# Patient Record
Sex: Male | Born: 1965
Health system: Southern US, Community
[De-identification: ages and names within clinical notes are randomized; demographics above are authoritative.]

## PROBLEM LIST (undated history)

## (undated) DIAGNOSIS — G8929 Other chronic pain: Secondary | ICD-10-CM

## (undated) DIAGNOSIS — M545 Low back pain, unspecified: Secondary | ICD-10-CM

## (undated) DIAGNOSIS — N200 Calculus of kidney: Secondary | ICD-10-CM

## (undated) DIAGNOSIS — G4730 Sleep apnea, unspecified: Secondary | ICD-10-CM

## (undated) DIAGNOSIS — E785 Hyperlipidemia, unspecified: Secondary | ICD-10-CM

## (undated) DIAGNOSIS — I2510 Atherosclerotic heart disease of native coronary artery without angina pectoris: Secondary | ICD-10-CM

## (undated) DIAGNOSIS — M109 Gout, unspecified: Secondary | ICD-10-CM

## (undated) DIAGNOSIS — E119 Type 2 diabetes mellitus without complications: Secondary | ICD-10-CM

## (undated) DIAGNOSIS — I10 Essential (primary) hypertension: Secondary | ICD-10-CM

## (undated) HISTORY — DX: Hyperlipidemia, unspecified: E78.5

## (undated) HISTORY — DX: Type 2 diabetes mellitus without complications: E11.9

## (undated) HISTORY — DX: Essential (primary) hypertension: I10

## (undated) HISTORY — DX: Sleep apnea, unspecified: G47.30

## (undated) HISTORY — DX: Gout, unspecified: M10.9

## (undated) HISTORY — PX: BACK SURGERY: SHX140

## (undated) HISTORY — PX: TONSILLECTOMY: SUR1361

---

## 2003-11-26 HISTORY — PX: LUMBAR LAMINECTOMY: SHX95

## 2004-12-05 ENCOUNTER — Ambulatory Visit (HOSPITAL_COMMUNITY): Admission: RE | Admit: 2004-12-05 | Discharge: 2004-12-06 | Payer: Self-pay | Admitting: Neurosurgery

## 2014-08-18 ENCOUNTER — Ambulatory Visit (INDEPENDENT_AMBULATORY_CARE_PROVIDER_SITE_OTHER): Payer: Managed Care, Other (non HMO) | Admitting: Cardiology

## 2014-08-18 ENCOUNTER — Encounter: Payer: Self-pay | Admitting: Cardiology

## 2014-08-18 VITALS — BP 140/102 | HR 72 | Ht 70.0 in | Wt 211.2 lb

## 2014-08-18 DIAGNOSIS — R079 Chest pain, unspecified: Secondary | ICD-10-CM | POA: Insufficient documentation

## 2014-08-18 DIAGNOSIS — R072 Precordial pain: Secondary | ICD-10-CM | POA: Insufficient documentation

## 2014-08-18 DIAGNOSIS — M546 Pain in thoracic spine: Secondary | ICD-10-CM | POA: Insufficient documentation

## 2014-08-18 NOTE — Progress Notes (Signed)
HPI The patient presents for evaluation of exertional chest discomfort. He has no history of coronary disease but he does have a strong family history of coronary disease. He has multiple cardiovascular risk factors. He's been getting chest discomfort since he started exercising recently. He had been walking long distances about a year ago but got out of this habit. He gained some weight. He is newly married and has started walking again. With this he has some chest tightness with his peak exertion. He says his heart rate has to be about 120 before this will happen. He would develop some midsternal discomfort. It might be 5/10. Goes away about 1 minute after he stopped he is doing. He does not describe associated symptoms such as nausea vomiting or diaphoresis. He is not having resting symptoms. He does not have shortness of breath and he denies any PND or orthopnea. He thinks it has been a relatively stable pattern.  No Known Allergies  Current Outpatient Prescriptions  Medication Sig Dispense Refill  . aspirin 81 MG tablet Take 81 mg by mouth daily.      Marland Kitchen losartan (COZAAR) 50 MG tablet Take 100 mg by mouth daily.       No current facility-administered medications for this visit.    Past Medical History  Diagnosis Date  . Diabetes     not since losing weight  . Hyperlipemia     mild  . HTN (hypertension)   . Gout   . Sleep apnea     not an issue with weight loss    Past Surgical History  Procedure Laterality Date  . Lumbar laminectomy      Family History  Problem Relation Age of Onset  . CAD Father 62    AVR  . CAD Mother 73    CABG  . Diabetes Brother     History   Social History  . Marital Status: Married    Spouse Name: N/A    Number of Children: 3  . Years of Education: N/A   Occupational History  . Sales    Social History Main Topics  . Smoking status: Never Smoker   . Smokeless tobacco: Not on file  . Alcohol Use: Yes     Comment: rare  . Drug Use:  No  . Sexual Activity: Not on file   Other Topics Concern  . Not on file   Social History Narrative   Lives at home with 4 children.      ROS:  As stated in the HPI and negative for all other systems.  PHYSICAL EXAM BP 140/102  Pulse 72  Ht  (1.778 m)  Wt 211 lb 3.2 oz (95.8 kg)  BMI 30.30 kg/m2 GENERAL:  Well appearing HEENT:  Pupils equal round and reactive, fundi not visualized, oral mucosa unremarkable NECK:  No jugular venous distention, waveform within normal limits, carotid upstroke brisk and symmetric, no bruits, no thyromegaly LYMPHATICS:  No cervical, inguinal adenopathy LUNGS:  Clear to auscultation bilaterally BACK:  No CVA tenderness CHEST:  Unremarkable HEART:  PMI not displaced or sustained,S1 and S2 within normal limits, no S3, no S4, no clicks, no rubs,  no murmurs ABD:  Flat, positive bowel sounds normal in frequency in pitch, no bruits, no rebound, no guarding, no midline pulsatile mass, no hepatomegaly, no splenomegaly EXT:  2 plus pulses throughout, no edema, no cyanosis no clubbing SKIN:  No rashes no nodules NEURO:  Cranial nerves II through XII grossly intact, motor  grossly intact throughout [facility]:  Cognitively intact, oriented to person place and time  EKG:   Sinus rhythm, rate 79, axis within normal limits,intervals within normal limits, no acute ST-T wave changes.  ASSESSMENT AND PLAN   CAD:  The pretest probability of obstructive coronary disease is moderate. Therefore, stress perfusion imaging is indicated according to current appropriateness guidelines and Bayes rule.  If he has a low risk normal study I would still consider coronary calcium score to further risk stratify and guide primary prevention therapy. Certainly he is an abnormal study we will pursue further management and testing. We had a long discussion about this.  DYSLIPIDEMIA:  He had labs drawn apparently today. I would be happy to review these. Certainly presence or absence of  coronary disease we'll guide our target therapy.  HTN:  His blood pressure is elevated but his losartan was just increased today.

## 2014-08-18 NOTE — Patient Instructions (Signed)
Your physician recommends that you schedule a follow-up appointment after your Stress test

## 2014-08-19 ENCOUNTER — Other Ambulatory Visit (HOSPITAL_COMMUNITY): Payer: Self-pay | Admitting: *Deleted

## 2014-08-19 ENCOUNTER — Encounter: Payer: Self-pay | Admitting: Family Medicine

## 2014-08-22 ENCOUNTER — Encounter: Payer: Self-pay | Admitting: Cardiology

## 2014-08-24 ENCOUNTER — Encounter: Payer: Self-pay | Admitting: Cardiology

## 2014-08-30 ENCOUNTER — Ambulatory Visit (HOSPITAL_COMMUNITY)
Admission: RE | Admit: 2014-08-30 | Discharge: 2014-08-30 | Disposition: A | Discharge status: Home / Self Care | Payer: Managed Care, Other (non HMO) | Source: Ambulatory Visit | Attending: Cardiovascular Disease | Admitting: Cardiovascular Disease

## 2014-08-30 ENCOUNTER — Telehealth: Payer: Self-pay | Admitting: *Deleted

## 2014-08-30 DIAGNOSIS — Z8249 Family history of ischemic heart disease and other diseases of the circulatory system: Secondary | ICD-10-CM | POA: Insufficient documentation

## 2014-08-30 DIAGNOSIS — R079 Chest pain, unspecified: Secondary | ICD-10-CM | POA: Insufficient documentation

## 2014-08-30 DIAGNOSIS — I10 Essential (primary) hypertension: Secondary | ICD-10-CM | POA: Diagnosis not present

## 2014-08-30 DIAGNOSIS — R42 Dizziness and giddiness: Secondary | ICD-10-CM | POA: Diagnosis not present

## 2014-08-30 DIAGNOSIS — R002 Palpitations: Secondary | ICD-10-CM | POA: Insufficient documentation

## 2014-08-30 DIAGNOSIS — E663 Overweight: Secondary | ICD-10-CM | POA: Diagnosis not present

## 2014-08-30 MED ORDER — REGADENOSON 0.4 MG/5ML IV SOLN
0.4000 mg | Freq: Once | INTRAVENOUS | Status: AC
  Administered 2014-08-30: 0.4 mg via INTRAVENOUS

## 2014-08-30 MED ORDER — TECHNETIUM TC 99M SESTAMIBI GENERIC - CARDIOLITE
30.0000 | Freq: Once | INTRAVENOUS | Status: AC | PRN
  Administered 2014-08-30: 30 via INTRAVENOUS

## 2014-08-30 MED ORDER — TECHNETIUM TC 99M SESTAMIBI GENERIC - CARDIOLITE
10.0000 | Freq: Once | INTRAVENOUS | Status: AC | PRN
  Administered 2014-08-30: 10 via INTRAVENOUS

## 2014-08-30 NOTE — Procedures (Addendum)
[facility] [address] [place] [address] [phone]  Cardiology Nuclear Med Study  Michael Duran is a 48 y.o. male     MRN : [id number]     DOB: 12/10/1965  Procedure Date: 08/30/2014  Nuclear Med Background Indication for Stress Test:  Evaluation for Ischemia History:  No prior respiratory or cardiac history reported;No prior NUC MPI for comparison. Cardiac Risk Factors: Family History - CAD, Hypertension, Lipids and Overweight  Symptoms:  Chest Pain, Light-Headedness and Palpitations   Nuclear Pre-Procedure Caffeine/Decaff Intake:  7:00pm NPO After: 5:00am   IV Site: R Forearm  IV 0.9% NS with Angio Cath:  22g  Chest Size (in):  44"  IV Started by: Berdie OgrenAmanda Wease, RN  Height: 5\' 10"  (1.778 m)  Cup Size: n/a  BMI:  Body mass index is 30.28 kg/(m^2). Weight:  211 lb (95.709 kg)   Tech Comments:  Test changed to Lexiscan due to patient not being able to continue walking on treadmill because of chest pain and EKG changes.    Nuclear Med Study 1 or 2 day study: 1 day  Stress Test Type:  Lexiscan  Order Authorizing Provider:  Rollene RotundaJames Hochrein, MD   Resting Radionuclide: Technetium 4893m Sestamibi  Resting Radionuclide Dose: 10.3 mCi   Stress Radionuclide:  Technetium 4093m Sestamibi  Stress Radionuclide Dose: 30.9 mCi           Stress Protocol Rest HR: 78 Stress HR: 108  Rest BP: 161/104 Stress BP: 185/98  Exercise Time (min): n/a METS: n/a          Dose of Adenosine (mg):  n/a Dose of Lexiscan: 0.4 mg  Dose of Atropine (mg): n/a Dose of Dobutamine: n/a mcg/kg/min (at max HR)  Stress Test Technologist: Ernestene MentionGwen Farrington, CCT Nuclear Technologist: [name], CNMT   Rest Procedure:  Myocardial perfusion imaging was performed at rest 45 minutes following the intravenous administration of Technetium 7893m Sestamibi. Stress Procedure:  The patient received IV Lexiscan 0.4 mg over 15-seconds.  Technetium 5893m Sestamibi  injected IV at 30-seconds.  There were no significant changes with Lexiscan.  Quantitative spect images were obtained after a 45 minute delay.  Transient Ischemic Dilatation (Normal <1.22):  1.20 QGS EDV:  124 ml QGS ESV:  56 ml LV Ejection Fraction: 55%        Rest ECG: NSR - Normal EKG  Stress ECG: Initial stress was with Bruce protocol; terminated due ST changes and chest pain; no ST changes with lexiscan infusion.  QPS Raw Data Images:  Acquisition technically good; normal left ventricular size. Stress Images:  There is decreased uptake in the inferior wall. Rest Images:  Normal homogeneous uptake in all areas of the myocardium. Subtraction (SDS):  These findings are consistent with ischemia.  Impression Exercise Capacity: Initial stress was with Bruce protocol; terminated due ST changes and chest pain; no ST changes with lexiscan infusion. BP Response:  Hypertensive blood pressure response. Clinical Symptoms:  There is dyspnea. ECG Impression:  Initial stress was with Bruce protocol; terminated due ST changes and chest pain; no ST changes with lexiscan infusion. Comparison with Prior Nuclear Study: No previous nuclear study performed  Overall Impression:  High risk stress nuclear study with a medium size, mild intensity, reversible inferior and apical defect consistent with ischemia; cannot R/O transient ischemic dilatation of LV cavity which is why study is felt to be high risk; note ECG changes and chest pain during Bruce protocol and stress  subsequently changed to lexiscan.  LV Wall Motion:  NL LV Function; NL Wall Motion   [name], MD  08/30/2014 11:56 AM

## 2014-08-30 NOTE — Telephone Encounter (Signed)
High risk nuc - read today by Dr. Jens Somrenshaw - appmt scheduled for 10/7 @1030am  with Wende MottS. Weaver PA. This information was communicated to patient along with address of this office location - agreeable to come in for earlier office visit r/t symptomatic high risk stress test.   Routed to Dr. Antoine PocheHochrein as Lorain ChildesFYI

## 2014-08-31 ENCOUNTER — Ambulatory Visit (INDEPENDENT_AMBULATORY_CARE_PROVIDER_SITE_OTHER): Payer: Managed Care, Other (non HMO) | Admitting: Physician Assistant

## 2014-08-31 ENCOUNTER — Inpatient Hospital Stay (HOSPITAL_COMMUNITY)
Admission: AD | Admit: 2014-08-31 | Discharge: 2014-09-07 | DRG: 234 | Disposition: A | Discharge status: Home / Self Care | Payer: Managed Care, Other (non HMO) | Source: Ambulatory Visit | Attending: Thoracic Surgery (Cardiothoracic Vascular Surgery) | Admitting: Thoracic Surgery (Cardiothoracic Vascular Surgery)

## 2014-08-31 ENCOUNTER — Encounter (HOSPITAL_COMMUNITY): Payer: Self-pay | Admitting: General Practice

## 2014-08-31 ENCOUNTER — Encounter (HOSPITAL_COMMUNITY)
Admission: AD | Disposition: A | Discharge status: Home / Self Care | Payer: Self-pay | Source: Ambulatory Visit | Attending: Thoracic Surgery (Cardiothoracic Vascular Surgery)

## 2014-08-31 ENCOUNTER — Encounter: Payer: Self-pay | Admitting: Physician Assistant

## 2014-08-31 VITALS — BP 150/85 | HR 75 | Ht 70.0 in | Wt 209.8 lb

## 2014-08-31 DIAGNOSIS — E785 Hyperlipidemia, unspecified: Secondary | ICD-10-CM

## 2014-08-31 DIAGNOSIS — Z7982 Long term (current) use of aspirin: Secondary | ICD-10-CM | POA: Diagnosis not present

## 2014-08-31 DIAGNOSIS — J9811 Atelectasis: Secondary | ICD-10-CM

## 2014-08-31 DIAGNOSIS — N189 Chronic kidney disease, unspecified: Secondary | ICD-10-CM | POA: Diagnosis present

## 2014-08-31 DIAGNOSIS — G8929 Other chronic pain: Secondary | ICD-10-CM | POA: Diagnosis present

## 2014-08-31 DIAGNOSIS — I9789 Other postprocedural complications and disorders of the circulatory system, not elsewhere classified: Secondary | ICD-10-CM | POA: Diagnosis not present

## 2014-08-31 DIAGNOSIS — I25110 Atherosclerotic heart disease of native coronary artery with unstable angina pectoris: Principal | ICD-10-CM | POA: Diagnosis present

## 2014-08-31 DIAGNOSIS — I10 Essential (primary) hypertension: Secondary | ICD-10-CM | POA: Diagnosis not present

## 2014-08-31 DIAGNOSIS — D62 Acute posthemorrhagic anemia: Secondary | ICD-10-CM | POA: Diagnosis present

## 2014-08-31 DIAGNOSIS — Z8249 Family history of ischemic heart disease and other diseases of the circulatory system: Secondary | ICD-10-CM

## 2014-08-31 DIAGNOSIS — I480 Paroxysmal atrial fibrillation: Secondary | ICD-10-CM

## 2014-08-31 DIAGNOSIS — M545 Low back pain: Secondary | ICD-10-CM | POA: Diagnosis present

## 2014-08-31 DIAGNOSIS — I129 Hypertensive chronic kidney disease with stage 1 through stage 4 chronic kidney disease, or unspecified chronic kidney disease: Secondary | ICD-10-CM | POA: Diagnosis present

## 2014-08-31 DIAGNOSIS — I25810 Atherosclerosis of coronary artery bypass graft(s) without angina pectoris: Secondary | ICD-10-CM

## 2014-08-31 DIAGNOSIS — K5669 Other intestinal obstruction: Secondary | ICD-10-CM | POA: Diagnosis not present

## 2014-08-31 DIAGNOSIS — I2584 Coronary atherosclerosis due to calcified coronary lesion: Secondary | ICD-10-CM | POA: Diagnosis present

## 2014-08-31 DIAGNOSIS — Y92239 Unspecified place in hospital as the place of occurrence of the external cause: Secondary | ICD-10-CM | POA: Diagnosis not present

## 2014-08-31 DIAGNOSIS — E119 Type 2 diabetes mellitus without complications: Secondary | ICD-10-CM | POA: Diagnosis present

## 2014-08-31 DIAGNOSIS — Z79899 Other long term (current) drug therapy: Secondary | ICD-10-CM | POA: Diagnosis not present

## 2014-08-31 DIAGNOSIS — E1169 Type 2 diabetes mellitus with other specified complication: Secondary | ICD-10-CM

## 2014-08-31 DIAGNOSIS — Z951 Presence of aortocoronary bypass graft: Secondary | ICD-10-CM

## 2014-08-31 DIAGNOSIS — G4733 Obstructive sleep apnea (adult) (pediatric): Secondary | ICD-10-CM | POA: Diagnosis present

## 2014-08-31 DIAGNOSIS — I2510 Atherosclerotic heart disease of native coronary artery without angina pectoris: Secondary | ICD-10-CM | POA: Diagnosis not present

## 2014-08-31 DIAGNOSIS — R9439 Abnormal result of other cardiovascular function study: Secondary | ICD-10-CM

## 2014-08-31 DIAGNOSIS — I2089 Other forms of angina pectoris: Secondary | ICD-10-CM

## 2014-08-31 DIAGNOSIS — I25118 Atherosclerotic heart disease of native coronary artery with other forms of angina pectoris: Secondary | ICD-10-CM

## 2014-08-31 DIAGNOSIS — Z833 Family history of diabetes mellitus: Secondary | ICD-10-CM

## 2014-08-31 DIAGNOSIS — Y832 Surgical operation with anastomosis, bypass or graft as the cause of abnormal reaction of the patient, or of later complication, without mention of misadventure at the time of the procedure: Secondary | ICD-10-CM | POA: Diagnosis not present

## 2014-08-31 DIAGNOSIS — I208 Other forms of angina pectoris: Secondary | ICD-10-CM | POA: Diagnosis present

## 2014-08-31 DIAGNOSIS — R0789 Other chest pain: Secondary | ICD-10-CM | POA: Diagnosis present

## 2014-08-31 DIAGNOSIS — R072 Precordial pain: Secondary | ICD-10-CM | POA: Diagnosis not present

## 2014-08-31 DIAGNOSIS — K5900 Constipation, unspecified: Secondary | ICD-10-CM | POA: Diagnosis not present

## 2014-08-31 DIAGNOSIS — Z01812 Encounter for preprocedural laboratory examination: Secondary | ICD-10-CM

## 2014-08-31 HISTORY — DX: Low back pain: M54.5

## 2014-08-31 HISTORY — DX: Atherosclerotic heart disease of native coronary artery without angina pectoris: I25.10

## 2014-08-31 HISTORY — DX: Low back pain, unspecified: M54.50

## 2014-08-31 HISTORY — PX: LEFT HEART CATHETERIZATION WITH CORONARY ANGIOGRAM: SHX5451

## 2014-08-31 HISTORY — PX: CARDIAC CATHETERIZATION: SHX172

## 2014-08-31 HISTORY — DX: Other chronic pain: G89.29

## 2014-08-31 HISTORY — DX: Calculus of kidney: N20.0

## 2014-08-31 LAB — PROTIME-INR
INR: 1.06 (ref 0.00–1.49)
Prothrombin Time: 13.8 seconds (ref 11.6–15.2)

## 2014-08-31 LAB — BASIC METABOLIC PANEL
ANION GAP: 11 (ref 5–15)
BUN: 13 mg/dL (ref 6–23)
CALCIUM: 9.6 mg/dL (ref 8.4–10.5)
CO2: 26 meq/L (ref 19–32)
CREATININE: 0.83 mg/dL (ref 0.50–1.35)
Chloride: 102 mEq/L (ref 96–112)
GFR calc Af Amer: >90 mL/min (ref >90)
Glucose, Bld: 102 mg/dL — ABNORMAL HIGH (ref 70–99)
Potassium: 4.4 mEq/L (ref 3.7–5.3)
Sodium: 139 mEq/L (ref 137–147)

## 2014-08-31 LAB — GLUCOSE, CAPILLARY
GLUCOSE-CAPILLARY: 106 mg/dL — AB (ref 70–99)
Glucose-Capillary: 98 mg/dL (ref 70–99)

## 2014-08-31 LAB — CBC
HCT: 40.5 % (ref 39.0–52.0)
HEMOGLOBIN: 14.5 g/dL (ref 13.0–17.0)
MCH: 28.8 pg (ref 26.0–34.0)
MCHC: 35.8 g/dL (ref 30.0–36.0)
MCV: 80.5 fL (ref 78.0–100.0)
PLATELETS: 217 10*3/uL (ref 150–400)
RBC: 5.03 MIL/uL (ref 4.22–5.81)
RDW: 12.8 % (ref 11.5–15.5)
WBC: 6.2 10*3/uL (ref 4.0–10.5)

## 2014-08-31 LAB — TROPONIN I: Troponin I: <0.3 ng/mL

## 2014-08-31 SURGERY — LEFT HEART CATHETERIZATION WITH CORONARY ANGIOGRAM
Anesthesia: LOCAL

## 2014-08-31 MED ORDER — ASPIRIN 81 MG PO CHEW: 81.0000 mg | CHEWABLE_TABLET | ORAL | Status: DC

## 2014-08-31 MED ORDER — ATORVASTATIN CALCIUM 80 MG PO TABS: 80.0000 mg | ORAL_TABLET | Freq: Every day | ORAL | Status: DC

## 2014-08-31 MED ORDER — MIDAZOLAM HCL 2 MG/2ML IJ SOLN
INTRAMUSCULAR | Status: AC
  Filled 2014-08-31: qty 2

## 2014-08-31 MED ORDER — SODIUM CHLORIDE 0.9 % IJ SOLN: 3.0000 mL | INTRAMUSCULAR | Status: DC | PRN

## 2014-08-31 MED ORDER — ISOSORBIDE MONONITRATE ER 60 MG PO TB24
60.0000 mg | ORAL_TABLET | Freq: Every day | ORAL | Status: DC
  Administered 2014-08-31 – 2014-09-01 (×2): 60 mg via ORAL
  Filled 2014-08-31 (×3): qty 1

## 2014-08-31 MED ORDER — NITROGLYCERIN 1 MG/10 ML FOR IR/CATH LAB
INTRA_ARTERIAL | Status: AC
  Filled 2014-08-31: qty 10

## 2014-08-31 MED ORDER — LOSARTAN POTASSIUM 50 MG PO TABS
100.0000 mg | ORAL_TABLET | Freq: Every day | ORAL | Status: DC
  Administered 2014-08-31 – 2014-09-01 (×2): 100 mg via ORAL
  Filled 2014-08-31 (×3): qty 2

## 2014-08-31 MED ORDER — SODIUM CHLORIDE 0.9 % IV SOLN
INTRAVENOUS | Status: DC
Start: 2014-08-31 — End: 2014-08-31
  Administered 2014-08-31: 14:00:00 via INTRAVENOUS

## 2014-08-31 MED ORDER — ACETAMINOPHEN 325 MG PO TABS
650.0000 mg | ORAL_TABLET | ORAL | Status: DC | PRN
  Administered 2014-08-31 – 2014-09-01 (×5): 650 mg via ORAL
  Filled 2014-08-31 (×5): qty 2

## 2014-08-31 MED ORDER — SODIUM CHLORIDE 0.9 % IV SOLN: INTRAVENOUS | Status: AC

## 2014-08-31 MED ORDER — HYDROCHLOROTHIAZIDE 12.5 MG PO CAPS
12.5000 mg | ORAL_CAPSULE | Freq: Every day | ORAL | Status: DC
  Administered 2014-08-31 – 2014-09-01 (×2): 12.5 mg via ORAL
  Filled 2014-08-31 (×3): qty 1

## 2014-08-31 MED ORDER — SODIUM CHLORIDE 0.9 % IV SOLN: 250.0000 mL | INTRAVENOUS | Status: DC | PRN

## 2014-08-31 MED ORDER — SODIUM CHLORIDE 0.9 % IJ SOLN: 3.0000 mL | Freq: Two times a day (BID) | INTRAMUSCULAR | Status: DC

## 2014-08-31 MED ORDER — ATORVASTATIN CALCIUM 80 MG PO TABS
80.0000 mg | ORAL_TABLET | Freq: Every day | ORAL | Status: DC
  Administered 2014-08-31 – 2014-09-06 (×5): 80 mg via ORAL
  Filled 2014-08-31 (×9): qty 1

## 2014-08-31 MED ORDER — VERAPAMIL HCL 2.5 MG/ML IV SOLN
INTRAVENOUS | Status: AC
  Filled 2014-08-31: qty 2

## 2014-08-31 MED ORDER — FENTANYL CITRATE 0.05 MG/ML IJ SOLN
INTRAMUSCULAR | Status: AC
  Filled 2014-08-31: qty 2

## 2014-08-31 MED ORDER — HEPARIN (PORCINE) IN NACL 2-0.9 UNIT/ML-% IJ SOLN
INTRAMUSCULAR | Status: AC
  Filled 2014-08-31: qty 1000

## 2014-08-31 MED ORDER — METOPROLOL SUCCINATE ER 50 MG PO TB24
50.0000 mg | ORAL_TABLET | Freq: Every day | ORAL | Status: DC
  Administered 2014-08-31 – 2014-09-01 (×2): 50 mg via ORAL
  Filled 2014-08-31 (×4): qty 1

## 2014-08-31 MED ORDER — ~~LOC~~ CARDIAC SURGERY, PATIENT & FAMILY EDUCATION
Freq: Once | Status: AC
  Administered 2014-08-31: 22:00:00
  Filled 2014-08-31: qty 1

## 2014-08-31 MED ORDER — ONDANSETRON HCL 4 MG/2ML IJ SOLN: 4.0000 mg | Freq: Four times a day (QID) | INTRAMUSCULAR | Status: DC | PRN

## 2014-08-31 MED ORDER — HEPARIN SODIUM (PORCINE) 1000 UNIT/ML IJ SOLN
INTRAMUSCULAR | Status: AC
  Filled 2014-08-31: qty 1

## 2014-08-31 MED ORDER — ASPIRIN 81 MG PO CHEW
CHEWABLE_TABLET | ORAL | Status: AC
  Administered 2014-08-31: 14:00:00
  Filled 2014-08-31: qty 1

## 2014-08-31 MED ORDER — METOPROLOL SUCCINATE ER 25 MG PO TB24: 25.0000 mg | ORAL_TABLET | Freq: Every day | ORAL | Status: DC

## 2014-08-31 MED ORDER — LIDOCAINE HCL (PF) 1 % IJ SOLN
INTRAMUSCULAR | Status: AC
  Filled 2014-08-31: qty 30

## 2014-08-31 MED ORDER — ASPIRIN EC 325 MG PO TBEC
325.0000 mg | DELAYED_RELEASE_TABLET | Freq: Every day | ORAL | Status: DC
  Administered 2014-09-01: 325 mg via ORAL
  Filled 2014-08-31 (×2): qty 1

## 2014-08-31 MED ORDER — MIDAZOLAM HCL 2 MG/2ML IJ SOLN
INTRAMUSCULAR | Status: AC
Start: 2014-08-31 — End: 2014-08-31
  Filled 2014-08-31: qty 2

## 2014-08-31 MED ORDER — ENOXAPARIN SODIUM 100 MG/ML ~~LOC~~ SOLN
95.0000 mg | Freq: Two times a day (BID) | SUBCUTANEOUS | Status: AC
  Administered 2014-09-01 (×2): 95 mg via SUBCUTANEOUS
  Filled 2014-08-31 (×3): qty 1

## 2014-08-31 MED ORDER — COLCHICINE 0.6 MG PO TABS: 0.6000 mg | ORAL_TABLET | Freq: Every day | ORAL | Status: DC

## 2014-08-31 NOTE — Progress Notes (Signed)
Cardiology Office Note    Date:  08/31/2014   ID:  Michael Duran, DOB [birth year], MRN [id number]  PCP:  Sissy Hoff, MD  Cardiologist:  Dr. Rollene Rotunda      History of Present Illness: Michael Duran is a 48 y.o. male with a history of diabetes, HTN, HL who was recently seen by Dr. Antoine Poche for exertional chest discomfort. Stress perfusion study was arranged. This was abnormal with inferior and apical ischemia and EKG changes during exercise. He is brought back today to discuss proceeding with cardiac catheterization for further evaluation.  He continues to note left-sided exertional chest discomfort. This resolves with rest. He thinks that he may have some associated dyspnea but no radiating symptoms, nausea or diaphoresis. He denies rest symptoms. He denies syncope. He denies orthopnea, PND or edema.   Studies:  - Nuclear (10/15):  Inferior and apical defect consistent with ischemia, cannot rule out TID, ECG changes and chest pain during Bruce protocol; high risk   Recent Labs/Images:  No results found for this basename: NA, K, BUN, CREATININE, ALT, HGB, TSH, LDL, LDLCALC, LDLDIRECT, HDL, BNP, PROBNP,  in the last 8760 hours     Wt Readings from Last 3 Encounters:  08/31/14 209 lb 12.8 oz (95.165 kg)  08/30/14 211 lb (95.709 kg)  08/18/14 211 lb 3.2 oz (95.8 kg)     Past Medical History  Diagnosis Date  . Diabetes     not since losing weight  . Hyperlipemia     mild  . HTN (hypertension)   . Gout   . Sleep apnea     not an issue with weight loss    Current Outpatient Prescriptions  Medication Sig Dispense Refill  . aspirin 81 MG tablet Take 81 mg by mouth daily.      Marland Kitchen COLCRYS 0.6 MG tablet Take 0.6 mg by mouth as needed. PRN FOR GOUT FLARE UP      . losartan (COZAAR) 50 MG tablet Take 100 mg by mouth daily.       No current facility-administered medications for this visit.     Allergies:   Review of patient's allergies indicates no known allergies.    Social History:  The patient  reports that he has never smoked. He does not have any smokeless tobacco history on file. He reports that he drinks alcohol. He reports that he does not use illicit drugs.   Family History:  The patient's family history includes CAD (age of onset: 60) in his father; CAD (age of onset: 15) in his mother; Diabetes in his brother; Heart attack in his father; Heart failure in his mother. There is no history of Stroke.   ROS:  Please see the history of present illness.      All other systems reviewed and negative.    PHYSICAL EXAM: VS:  BP 150/85  Pulse 75  Ht 5\' 10"  (1.778 m)  Wt 209 lb 12.8 oz (95.165 kg)  BMI 30.10 kg/m2 Well nourished, well developed, in no acute distress HEENT: normal Neck: no JVD Cardiac:  normal S1, S2; RRR; no murmur Lungs:  clear to auscultation bilaterally, no wheezing, rhonchi or rales Abd: soft, nontender, no hepatomegaly Ext: no edema Skin: warm and dry Neuro:  CNs 2-12 intact, no focal abnormalities noted  EKG:  NSR, HR 75, inferior Q waves, no ST changes      ASSESSMENT AND PLAN:  1.  Precordial pain:  Patient has symptoms consistent with CCS class III  angina. He has a recent Myoview study that is high risk with inferior and apical ischemia and possible transient ischemic dilatation.  Dr. Antoine PocheHochrein spoke with the patient last night. He has been brought back today to arrange cardiac catheterization. He has not eaten since last night and is under the impression that he was set up for the procedure today.  Risks and benefits of cardiac catheterization have been discussed with the patient.  These include bleeding, infection, kidney damage, stroke, heart attack, death.  The patient understands these risks and is willing to proceed.  He has been placed on the add on board. He [facility]. Continue aspirin. Start Toprol-XL 25 mg daily. Initiate statin therapy.  2.  Type 2 diabetes  mellitus without complication:  Good control with diet. Followup with primary care.  3.  Essential hypertension:  BP elevated. Add Toprol as noted.  4.  HLD (hyperlipidemia): Start Lipitor 80 mg daily.   Disposition:    FU with Dr. Antoine PocheHochrein as scheduled.   Signed, Brynda RimScott Dina Mobley, PA-C, MHS 08/31/2014 11:04 AM    [facility] [address], [place], [place]  [address] Phone: [phone]; Fax: [phone]

## 2014-08-31 NOTE — Interval H&P Note (Signed)
pciCath Lab Visit (complete for each Cath Lab visit)  Clinical Evaluation Leading to the Procedure:   ACS: Yes.    Non-ACS:    Anginal Classification: CCS III  Anti-ischemic medical therapy: Minimal Therapy (1 class of medications)  Non-Invasive Test Results: Intermediate-risk stress test findings: cardiac mortality 1-3%/year  Prior CABG: No previous CABG      History and Physical Interval Note:  08/31/2014 4:08 PM  Michael Duran  has presented today for surgery, with the diagnosis of abn stress test  The various methods of treatment have been discussed with the patient and family. After consideration of risks, benefits and other options for treatment, the patient has consented to  Procedure(s): LEFT HEART CATHETERIZATION WITH CORONARY ANGIOGRAM (N/A) as a surgical intervention .  The patient's history has been reviewed, patient examined, no change in status, stable for surgery.  I have reviewed the patient's chart and labs.  Questions were answered to the patient's satisfaction.     [name],[place] Yearby W

## 2014-08-31 NOTE — H&P (Signed)
History and Physical    Date:  08/31/2014   ID:  Michael Duran, DOB 07/10/1966, MRN [id number]  PCP:  Sissy HoffSWAYNE,DAVID W, MD  Cardiologist:  Dr. Rollene RotundaJames Hochrein      History of Present Illness: Michael PartridgeDonald E Duran is a 48 y.o. male with a history of diabetes, HTN, HL who was recently seen by Dr. Antoine PocheHochrein for exertional chest discomfort. Stress perfusion study was arranged. This was abnormal with inferior and apical ischemia and EKG changes during exercise. He is brought back today to discuss proceeding with cardiac catheterization for further evaluation.  He continues to note left-sided exertional chest discomfort. This resolves with rest. He thinks that he may have some associated dyspnea but no radiating symptoms, nausea or diaphoresis. He denies rest symptoms. He denies syncope. He denies orthopnea, PND or edema.   Studies:  - Nuclear (10/15):  Inferior and apical defect consistent with ischemia, cannot rule out TID, ECG changes and chest pain during Bruce protocol; high risk   Recent Labs/Images:  No results found for this basename: NA, K, BUN, CREATININE, ALT, HGB, TSH, LDL, LDLCALC, LDLDIRECT, HDL, BNP, PROBNP,  in the last 8760 hours     Wt Readings from Last 3 Encounters:  08/31/14 209 lb 12.8 oz (95.165 kg)  08/30/14 211 lb (95.709 kg)  08/18/14 211 lb 3.2 oz (95.8 kg)     Past Medical History  Diagnosis Date  . Diabetes     not since losing weight  . Hyperlipemia     mild  . HTN (hypertension)   . Gout   . Sleep apnea     not an issue with weight loss    Current Outpatient Prescriptions  Medication Sig Dispense Refill  . aspirin 81 MG tablet Take 81 mg by mouth daily.      Marland Kitchen. COLCRYS 0.6 MG tablet Take 0.6 mg by mouth as needed. PRN FOR GOUT FLARE UP      . losartan (COZAAR) 50 MG tablet Take 100 mg by mouth daily.       No current facility-administered medications for this visit.     Allergies:   Review of patient's allergies indicates no known allergies.     Social History:  The patient  reports that he has never smoked. He does not have any smokeless tobacco history on file. He reports that he drinks alcohol. He reports that he does not use illicit drugs.   Family History:  The patient's family history includes CAD (age of onset: 2360) in his father; CAD (age of onset: 3069) in his mother; Diabetes in his brother; Heart attack in his father; Heart failure in his mother. There is no history of Stroke.   ROS:  Please see the history of present illness.      All other systems reviewed and negative.    PHYSICAL EXAM: VS:  BP 150/85  Pulse 75  Ht 5\' 10"  (1.778 m)  Wt 209 lb 12.8 oz (95.165 kg)  BMI 30.10 kg/m2 Well nourished, well developed, in no acute distress HEENT: normal Neck: no JVD Cardiac:  normal S1, S2; RRR; no murmur Lungs:  clear to auscultation bilaterally, no wheezing, rhonchi or rales Abd: soft, nontender, no hepatomegaly Ext: no edema Skin: warm and dry Neuro:  CNs 2-12 intact, no focal abnormalities noted  EKG:  NSR, HR 75, inferior Q waves, no ST changes      ASSESSMENT AND PLAN:  1.  Precordial pain:  Patient has symptoms consistent with CCS class  III angina. He has a recent Myoview study that is high risk with inferior and apical ischemia and possible transient ischemic dilatation.  Dr. Antoine Poche spoke with the patient last night. He has been brought back today to arrange cardiac catheterization. He has not eaten since last night and is under the impression that he was set up for the procedure today.  Risks and benefits of cardiac catheterization have been discussed with the patient.  These include bleeding, infection, kidney damage, stroke, heart attack, death.  The patient understands these risks and is willing to proceed.  He has been placed on the add on board. He will go directly to the short stay area at [facility] today. Continue aspirin. Start Toprol-XL 25 mg daily. Initiate statin therapy.  2.  Type 2 diabetes  mellitus without complication:  Good control with diet. Followup with primary care.  3.  Essential hypertension:  BP elevated. Add Toprol as noted.  4.  HLD (hyperlipidemia): Start Lipitor 80 mg daily.   Disposition:    FU with Dr. Antoine Poche as scheduled.   Signed, Brynda Rim, MHS 08/31/2014 11:04 AM    [facility] [address] [place], [place], [place]  [address] Phone: [phone]; Fax: [phone]

## 2014-08-31 NOTE — Progress Notes (Signed)
Transported pt to 6c in bed, no acute distress. No chest pain, no sob. Alert oriented. Tr band in place, 3cc air aspirated at 1745 with no complication.

## 2014-08-31 NOTE — Patient Instructions (Signed)
START LIPITOR 80 MG DAILY  START TOPROL, XL 25 MG DAILY  YOU HAVE BEEN SCHEDULED FOR LEFT HEART CATH  KEEP YOUR APPT WITH DR. HOCHREIN

## 2014-08-31 NOTE — Interval H&P Note (Signed)
Cath Lab Visit (complete for each Cath Lab visit)  Clinical Evaluation Leading to the Procedure:   ACS: Yes.    Non-ACS:    Anginal Classification: CCS IV  Anti-ischemic medical therapy: Minimal Therapy (1 class of medications)  Non-Invasive Test Results: Intermediate-risk stress test findings: cardiac mortality 1-3%/year  Prior CABG: No previous CABG      History and Physical Interval Note:  08/31/2014 1:57 PM  Michael Duran  has presented today for surgery, with the diagnosis of abn stress test  The various methods of treatment have been discussed with the patient and family. After consideration of risks, benefits and other options for treatment, the patient has consented to  Procedure(s): LEFT HEART CATHETERIZATION WITH CORONARY ANGIOGRAM (N/A) as a surgical intervention .  The patient's history has been reviewed, patient examined, no change in status, stable for surgery.  I have reviewed the patient's chart and labs.  Questions were answered to the patient's satisfaction.     Michael Duran,HENRY W

## 2014-08-31 NOTE — Progress Notes (Signed)
ANTICOAGULATION CONSULT NOTE - Initial Consult  Pharmacy Consult for Lovenox Indication: chest pain/ACS  No Known Allergies  Patient Measurements: Height: 5\' 10"  (177.8 cm) Weight: 209 lb (94.802 kg) IBW/kg (Calculated) : 73   Vital Signs: Temp: 97.9 F (36.6 C) (10/07 1229) Temp Source: Oral (10/07 1229) BP: 180/106 mmHg (10/07 1741) Pulse Rate: 68 (10/07 1746)  Labs:  Recent Labs  08/31/14 1330  HGB 14.5  HCT 40.5  PLT 217  LABPROT 13.8  INR 1.06  CREATININE 0.83    Estimated Creatinine Clearance: 124.4 ml/min (by C-G formula based on Cr of 0.83).   Medical History: Past Medical History  Diagnosis Date  . Diabetes     not since losing weight  . Hyperlipemia     mild  . HTN (hypertension)   . Gout   . Sleep apnea     not an issue with weight loss    Medications:  Prescriptions prior to admission  Medication Sig Dispense Refill  . losartan (COZAAR) 50 MG tablet Take 100 mg by mouth daily.      Marland Kitchen. aspirin 81 MG tablet Take 81 mg by mouth daily.      Marland Kitchen. atorvastatin (LIPITOR) 80 MG tablet Take 1 tablet (80 mg total) by mouth daily.  30 tablet  11  . COLCRYS 0.6 MG tablet Take 0.6 mg by mouth as needed. PRN FOR GOUT FLARE UP      . metoprolol succinate (TOPROL-XL) 25 MG 24 hr tablet Take 1 tablet (25 mg total) by mouth daily.  30 tablet  11    Assessment: 48 year old man s/p cardiac cath today to start on Lovenox.  Severe two vessel diseases found.  Planning CABG on Friday per RN. Goal of Therapy:  Anti-Xa level 0.6-1 units/ml 4hrs after LMWH dose given Monitor platelets by anticoagulation protocol: Yes   Plan:  Lovenox 95mg  SQ q12h starting 10/8 at 0400 (8 hours after sheath removed).  Mickeal SkinnerFrens, Sidney Kann John 08/31/2014,6:52 PM

## 2014-08-31 NOTE — CV Procedure (Addendum)
     Left Heart Catheterization with Coronary Angiography  Report  Michael Duran  48 y.o.  male 01/12/1966  Procedure Date: 08/31/2014 Referring Physician: Antoine PocheHochrein, M.D. Primary Cardiologist: Angelina SheriffJake Hochrein, M.D.  INDICATIONS: Crescendo angina/class III with high-risk myocardial perfusion study  PROCEDURE: 1. Left heart catheterization; 2. Coronary angiography; 3. Left ventriculography  CONSENT:  The risks, benefits, and details of the procedure were explained in detail to the patient. Risks including death, stroke, heart attack, kidney injury, allergy, limb ischemia, bleeding and radiation injury were discussed.  The patient verbalized understanding and wanted to proceed.  Informed written consent was obtained.  PROCEDURE TECHNIQUE:  After Xylocaine anesthesia a 5 French Slender sheath was placed in the right radial artery with an angiocath and the modified Seldinger technique.  Coronary angiography was done using a 5 F JR 4 and JL 3.5 cm diagnostic catheter.  Left ventriculography was done using the JR 4 catheter and hand injection.   We spent considerable time reviewing the images. I believe the distal right coronary high-grade stenosis is causing the majority of the patient's symptoms. The LAD is severely and diffusely diseased from the proximal to the midsegment. There are regions of ectasia. The mid to proximal vessel just beyond a relative region of ectasia/aneurysm formation, there is an 80% eccentric stenosis. There is also distal LAD stenosis near the apex that is approximately 60-70%.   CONTRAST:  Total of 100 cc.  COMPLICATIONS:  None   HEMODYNAMICS:  Aortic pressure 137/82 mmHg; LV pressure 140/7 mmHg; LVEDP 18 mm mercury  ANGIOGRAPHIC DATA:   The left main coronary artery is calcified and contains a distal nonocclusive intimal flap. There is less than 25% stenosis of the left main..  The left anterior descending artery is severely diseased with ectatic/aneurysmal  segments with regions of focal eccentric stenosis up to 80% pending upon review chosen. The distal/apical LAD contains 75-80% narrowing. The proximal to mid LAD is heavily calcified. 3 small diagonals are free of any significant obstruction.  The left circumflex artery is is also calcified. There is proximal eccentric 30-50% narrowing. The circumflex then bifurcates into a large continuation obtuse marginal branch and a much smaller inferior branch. The small branch contains 95% proximal stenosis. This vessel is small in caliber and not.  The right coronary artery is dominant, ectatic, and heavily calcified. The distal vessel within a heavily calcified segment the has angiographic appearance of a region of stent, contains 99% stenosis before terminating a large PDA and 2 left ventricular branches.  LEFT VENTRICULOGRAM:  Left ventricular angiogram was done in the 30 RAO projection and revealed normal contractility without regional wall motion abnormality. EF 65% the   IMPRESSIONS:  1. Severe two-vessel coronary disease involving proximal to mid LAD and distal RCA before the origin of PDA and left ventricular branches. Circumflex has mild to moderate proximal disease.  2. Normal left ventricular function. 3. High-risk myocardial perfusion study, with a large region of inferior ischemia, apical ischemia, and transient ischemic dilatation.   RECOMMENDATION:  The patient could likely be treated with PCI to the RCA. The LAD would be technically challenging but possible to treat with PCI. When I consider his young age and the myocardium at risk, bypass grafting with arterial conduits would probably be his best long-term option.  TCTS consult to consider coronary bypass grafting with arterial conduits..Marland Kitchen

## 2014-08-31 NOTE — Consult Note (Signed)
Reason for Consult: severe 2 vessel CAD Referring Physician: Dr. Daneen Schick, Minus Breeding  Michael Duran is an 48 y.o. male.  HPI: 48 yo man presents with a cc/o chest pain  Michael Duran is a 48 yo man with no prior history of coronary disease. He has a strong family history of coronary disease, hypertension and hyperlipidemia. He was diagnosed at one time with diabetes, but never required medication. He lost 70 pounds with diet and weight loss, and his A1C has been normal since then. He has been having exertional CP. This does not occur with normal activities, but if walks quickly for exercise he will get substernal chest pressure that is relieved by rest. He says his heart rate has to be about 120 before this will happen. He does not describe associated symptoms such as nausea, vomiting or diaphoresis. He has not had pain at rest. He does not have shortness of breath and he denies any PND or orthopnea.   He had a stress test which showed anterior and inferior ischemia.  Today he had cardiac catheterization which showed severe 2 vessel CAD of the proximal LAD and distal RCA. Lv function was preserved.   Past Medical History  Diagnosis Date  . Diabetes     not since losing weight  . Hyperlipemia     mild  . HTN (hypertension)   . Gout   . Sleep apnea     not an issue with weight loss    Past Surgical History  Procedure Laterality Date  . Lumbar laminectomy      Family History  Problem Relation Age of Onset  . CAD Father 96    AVR  . CAD Mother 34    CABG  . Diabetes Brother   . Heart attack Father   . Heart failure Mother   . Stroke Neg Hx     Social History:  reports that he has never smoked. He does not have any smokeless tobacco history on file. He reports that he drinks alcohol. He reports that he does not use illicit drugs.  Allergies: No Known Allergies  Medications:  Prior to Admission:  Prescriptions prior to admission  Medication Sig Dispense Refill  .  losartan (COZAAR) 50 MG tablet Take 100 mg by mouth daily.      Marland Kitchen aspirin 81 MG tablet Take 81 mg by mouth daily.      Marland Kitchen atorvastatin (LIPITOR) 80 MG tablet Take 1 tablet (80 mg total) by mouth daily.  30 tablet  11  . COLCRYS 0.6 MG tablet Take 0.6 mg by mouth as needed. PRN FOR GOUT FLARE UP      . metoprolol succinate (TOPROL-XL) 25 MG 24 hr tablet Take 1 tablet (25 mg total) by mouth daily.  30 tablet  11    Results for orders placed during the hospital encounter of 08/31/14 (from the past 48 hour(s))  BASIC METABOLIC PANEL     Status: Abnormal   Collection Time    08/31/14  1:30 PM      Result Value Ref Range   Sodium 139  137 - 147 mEq/L   Potassium 4.4  3.7 - 5.3 mEq/L   Chloride 102  96 - 112 mEq/L   CO2 26  19 - 32 mEq/L   Glucose, Bld 102 (*) 70 - 99 mg/dL   BUN 13  6 - 23 mg/dL   Creatinine, Ser 0.83  0.50 - 1.35 mg/dL   Calcium 9.6  8.4 -  10.5 mg/dL   GFR calc non Af Amer >90  >90 mL/min   GFR calc Af Amer >90  >90 mL/min   Comment: (NOTE)     The eGFR has been calculated using the CKD EPI equation.     This calculation has not been validated in all clinical situations.     eGFR's persistently <90 mL/min signify possible Chronic Kidney     Disease.   Anion gap 11  5 - 15  PROTIME-INR     Status: None   Collection Time    08/31/14  1:30 PM      Result Value Ref Range   Prothrombin Time 13.8  11.6 - 15.2 seconds   INR 1.06  0.00 - 1.49  CBC     Status: None   Collection Time    08/31/14  1:30 PM      Result Value Ref Range   WBC 6.2  4.0 - 10.5 K/uL   RBC 5.03  4.22 - 5.81 MIL/uL   Hemoglobin 14.5  13.0 - 17.0 g/dL   HCT 40.5  39.0 - 52.0 %   MCV 80.5  78.0 - 100.0 fL   MCH 28.8  26.0 - 34.0 pg   MCHC 35.8  30.0 - 36.0 g/dL   RDW 12.8  11.5 - 15.5 %   Platelets 217  150 - 400 K/uL  GLUCOSE, CAPILLARY     Status: None   Collection Time    08/31/14  5:11 PM      Result Value Ref Range   Glucose-Capillary 98  70 - 99 mg/dL    No results found.  Review of  Systems  Constitutional: Positive for malaise/fatigue. Negative for fever.  Respiratory: Negative for wheezing.   Cardiovascular: Positive for chest pain. Negative for orthopnea, leg swelling and PND.  Musculoskeletal: Positive for back pain.  Endo/Heme/Allergies: Does not bruise/bleed easily.  All other systems reviewed and are negative.  Blood pressure 180/106, pulse 68, temperature 97.9 F (36.6 C), temperature source Oral, resp. rate 10, height _0  (1.778 m), weight 209 lb (94.802 kg), SpO2 98.00%. Physical Exam  Constitutional: He is oriented to person, place, and time. He appears well-developed and well-nourished. No distress.  HENT:  Head: Normocephalic and atraumatic.  Eyes: EOM are normal. Pupils are equal, round, and reactive to light.  Neck: Neck supple. No thyromegaly present.  No carotid bruits  Cardiovascular: Normal rate, regular rhythm and intact distal pulses.   Murmur (2/6 systolic) heard. Respiratory: Effort normal and breath sounds normal. He has no wheezes. He has no rales.  GI: Soft. There is no tenderness.  Musculoskeletal: He exhibits no edema.  Lymphadenopathy:    He has no cervical adenopathy.  Neurological: He is alert and oriented to person, place, and time. No cranial nerve deficit.  No motor deficit  Skin: Skin is warm and dry.  Psychiatric: He has a normal mood and affect.   CARDIAC CATHETERIZATION LEFT VENTRICULOGRAM: Left ventricular angiogram was done in the 30 RAO projection and revealed normal contractility without regional wall motion abnormality. EF 65% the  IMPRESSIONS: 1. Severe two-vessel coronary disease involving proximal to mid LAD and distal RCA before the origin of PDA and left ventricular branches. Circumflex has mild to moderate proximal disease.  2. Normal left ventricular function.  3. High-risk myocardial perfusion study, with a large region of inferior ischemia, apical ischemia, and transient ischemic dilatation.   RECOMMENDATION: The patient could likely be treated with PCI to the RCA. The  LAD was likewise be technically challenging but possible to treat with PCI. When I consider his young age and the myocardium at risk, bypass grafting with arterial conduits would probably be his best long-term option.   Assessment/Plan: 48 yo man with multiple CRF who presents with exertional angina. He has severe 2 vessel CAD, including complex proximal LAD disease. CABG is indicated for survival benefit and relief of symptoms.  I discussed the general nature of the procedure, the need for general anesthesia, and the incisions to be used with Michael Duran. We discussed the expected hospital stay, overall recovery and short and long term outcomes. We reviewed the indications, risks, benefits and alternatives. They understand the risks include, but are not limited to death, stroke, MI, DVT/PE, bleeding, possible need for transfusion, infections, cardiac arrhythmias, other organ system dysfunction including respiratory, renal, or GI complications. He accepts the risks and agrees to proceed.  Plan CABG x 2 with bilateral mammary arteries on Friday 10/9  Carrolyn Hilmes C 08/31/2014, 6:28 PM

## 2014-09-01 ENCOUNTER — Inpatient Hospital Stay (HOSPITAL_COMMUNITY): Payer: Managed Care, Other (non HMO)

## 2014-09-01 ENCOUNTER — Other Ambulatory Visit: Payer: Self-pay

## 2014-09-01 ENCOUNTER — Encounter (HOSPITAL_COMMUNITY): Payer: Managed Care, Other (non HMO)

## 2014-09-01 DIAGNOSIS — E1169 Type 2 diabetes mellitus with other specified complication: Secondary | ICD-10-CM

## 2014-09-01 DIAGNOSIS — I25810 Atherosclerosis of coronary artery bypass graft(s) without angina pectoris: Secondary | ICD-10-CM

## 2014-09-01 DIAGNOSIS — I208 Other forms of angina pectoris: Secondary | ICD-10-CM

## 2014-09-01 DIAGNOSIS — Z01810 Encounter for preprocedural cardiovascular examination: Secondary | ICD-10-CM

## 2014-09-01 DIAGNOSIS — I2510 Atherosclerotic heart disease of native coronary artery without angina pectoris: Secondary | ICD-10-CM

## 2014-09-01 DIAGNOSIS — E785 Hyperlipidemia, unspecified: Secondary | ICD-10-CM

## 2014-09-01 LAB — TYPE AND SCREEN
ABO/RH(D): O NEG
Antibody Screen: NEGATIVE

## 2014-09-01 LAB — BLOOD GAS, ARTERIAL
Acid-Base Excess: 2.1 mmol/L — ABNORMAL HIGH (ref 0.0–2.0)
Bicarbonate: 26 mEq/L — ABNORMAL HIGH (ref 20.0–24.0)
Drawn by: 257081
FIO2: 0.21 %
O2 Saturation: 94.7 %
PH ART: 7.428 (ref 7.350–7.450)
Patient temperature: 98.6
TCO2: 27.3 mmol/L (ref 0–100)
pCO2 arterial: 40.1 mmHg (ref 35.0–45.0)
pO2, Arterial: 67.2 mmHg — ABNORMAL LOW (ref 80.0–100.0)

## 2014-09-01 LAB — GLUCOSE, CAPILLARY
Glucose-Capillary: 107 mg/dL — ABNORMAL HIGH (ref 70–99)
Glucose-Capillary: 106 mg/dL — ABNORMAL HIGH (ref 70–99)
Glucose-Capillary: 99 mg/dL (ref 70–99)
Glucose-Capillary: 120 mg/dL — ABNORMAL HIGH (ref 70–99)

## 2014-09-01 LAB — URINALYSIS, ROUTINE W REFLEX MICROSCOPIC
Bilirubin Urine: NEGATIVE
GLUCOSE, UA: NEGATIVE mg/dL
Hgb urine dipstick: NEGATIVE
KETONES UR: NEGATIVE mg/dL
Leukocytes, UA: NEGATIVE
NITRITE: NEGATIVE
Protein, ur: NEGATIVE mg/dL
Specific Gravity, Urine: 1.033 — ABNORMAL HIGH (ref 1.005–1.030)
Urobilinogen, UA: 0.2 mg/dL (ref 0.0–1.0)
pH: 5.5 (ref 5.0–8.0)

## 2014-09-01 LAB — ABO/RH: ABO/RH(D): O NEG

## 2014-09-01 LAB — SURGICAL PCR SCREEN
MRSA, PCR: NEGATIVE
Staphylococcus aureus: NEGATIVE

## 2014-09-01 MED ORDER — EPINEPHRINE HCL 1 MG/ML IJ SOLN
0.5000 ug/min | INTRAMUSCULAR | Status: DC
  Filled 2014-09-01: qty 4

## 2014-09-01 MED ORDER — DEXMEDETOMIDINE HCL IN NACL 400 MCG/100ML IV SOLN
0.1000 ug/kg/h | INTRAVENOUS | Status: AC
  Administered 2014-09-02: 0.2 ug/kg/h via INTRAVENOUS
  Filled 2014-09-01: qty 100

## 2014-09-01 MED ORDER — DOPAMINE-DEXTROSE 3.2-5 MG/ML-% IV SOLN
2.0000 ug/kg/min | INTRAVENOUS | Status: DC
  Filled 2014-09-01: qty 250

## 2014-09-01 MED ORDER — TEMAZEPAM 15 MG PO CAPS: 15.0000 mg | ORAL_CAPSULE | Freq: Once | ORAL | Status: AC | PRN

## 2014-09-01 MED ORDER — ALPRAZOLAM 0.25 MG PO TABS
0.2500 mg | ORAL_TABLET | ORAL | Status: DC | PRN
  Administered 2014-09-02: 05:00:00 0.5 mg via ORAL
  Filled 2014-09-01: qty 2

## 2014-09-01 MED ORDER — CHLORHEXIDINE GLUCONATE CLOTH 2 % EX PADS
6.0000 | MEDICATED_PAD | Freq: Once | CUTANEOUS | Status: AC
  Administered 2014-09-02: 6 via TOPICAL

## 2014-09-01 MED ORDER — PHENYLEPHRINE HCL 10 MG/ML IJ SOLN
30.0000 ug/min | INTRAMUSCULAR | Status: AC
  Administered 2014-09-02: 20 ug/min via INTRAVENOUS
  Filled 2014-09-01: qty 2

## 2014-09-01 MED ORDER — DEXTROSE 5 % IV SOLN
1.5000 g | INTRAVENOUS | Status: AC
  Administered 2014-09-02: 1.5 g via INTRAVENOUS
  Filled 2014-09-01: qty 1.5

## 2014-09-01 MED ORDER — SODIUM CHLORIDE 0.9 % IV SOLN
INTRAVENOUS | Status: DC
  Filled 2014-09-01: qty 30

## 2014-09-01 MED ORDER — POTASSIUM CHLORIDE 2 MEQ/ML IV SOLN
80.0000 meq | INTRAVENOUS | Status: DC
  Filled 2014-09-01: qty 40

## 2014-09-01 MED ORDER — AMINOCAPROIC ACID 250 MG/ML IV SOLN
INTRAVENOUS | Status: AC
  Administered 2014-09-02: 69.8 mL/h via INTRAVENOUS
  Filled 2014-09-01: qty 40

## 2014-09-01 MED ORDER — PLASMA-LYTE 148 IV SOLN
INTRAVENOUS | Status: AC
  Administered 2014-09-02: 09:00:00
  Filled 2014-09-01: qty 2.5

## 2014-09-01 MED ORDER — CHLORHEXIDINE GLUCONATE CLOTH 2 % EX PADS
6.0000 | MEDICATED_PAD | Freq: Once | CUTANEOUS | Status: AC
  Administered 2014-09-01: 22:00:00 6 via TOPICAL

## 2014-09-01 MED ORDER — NITROGLYCERIN IN D5W 200-5 MCG/ML-% IV SOLN
2.0000 ug/min | INTRAVENOUS | Status: AC
  Administered 2014-09-02: 5 ug/min via INTRAVENOUS
  Filled 2014-09-01: qty 250

## 2014-09-01 MED ORDER — VANCOMYCIN HCL 10 G IV SOLR
1250.0000 mg | INTRAVENOUS | Status: AC
  Administered 2014-09-02: 1250 mg via INTRAVENOUS
  Filled 2014-09-01: qty 1250

## 2014-09-01 MED ORDER — DEXTROSE 5 % IV SOLN
750.0000 mg | INTRAVENOUS | Status: DC
  Filled 2014-09-01: qty 750

## 2014-09-01 MED ORDER — METOPROLOL TARTRATE 12.5 MG HALF TABLET
12.5000 mg | ORAL_TABLET | Freq: Once | ORAL | Status: AC
  Administered 2014-09-02: 05:00:00 12.5 mg via ORAL
  Filled 2014-09-01: qty 1

## 2014-09-01 MED ORDER — BISACODYL 5 MG PO TBEC: 5.0000 mg | DELAYED_RELEASE_TABLET | Freq: Once | ORAL | Status: DC

## 2014-09-01 MED ORDER — MAGNESIUM SULFATE 50 % IJ SOLN
40.0000 meq | INTRAMUSCULAR | Status: DC
  Filled 2014-09-01: qty 10

## 2014-09-01 MED ORDER — SODIUM CHLORIDE 0.9 % IV SOLN
INTRAVENOUS | Status: AC
  Administered 2014-09-02: 2.6 [IU]/h via INTRAVENOUS
  Filled 2014-09-01: qty 2.5

## 2014-09-01 MED ORDER — CHLORHEXIDINE GLUCONATE 4 % EX LIQD
CUTANEOUS | Status: AC
  Administered 2014-09-01: 22:00:00
  Filled 2014-09-01: qty 30

## 2014-09-01 NOTE — Progress Notes (Signed)
1 Day Post-Op Procedure(s) (LRB): LEFT HEART CATHETERIZATION WITH CORONARY ANGIOGRAM (N/A) Subjective: No complaints Anxious about CABG  Objective: Vital signs in last 24 hours: Temp:  [97.7 F (36.5 C)-97.9 F (36.6 C)] 97.7 F (36.5 C) (10/08 0436) Pulse Rate:  [53-79] 53 (10/08 0436) Cardiac Rhythm:  [-] Normal sinus rhythm (10/08 0800) Resp:  [10-25] 16 (10/08 0436) BP: (109-180)/(67-106) 109/74 mmHg (10/08 0436) SpO2:  [95 %-100 %] 96 % (10/08 0436) Weight:  [214 lb 1.1 oz (97.1 kg)] 214 lb 1.1 oz (97.1 kg) (10/08 0436)  Hemodynamic parameters for last 24 hours:    Intake/Output from previous day: 10/07 0701 - 10/08 0700 In: 588.8 [P.O.:120; I.V.:468.8] Out: 900 [Urine:900] Intake/Output this shift: Total I/O In: 240 [P.O.:240] Out: 400 [Urine:400]  General appearance: alert and no distress Neurologic: intact Heart: regular rate and rhythm Lungs: clear to auscultation bilaterally  Lab Results:  Recent Labs  08/31/14 1330  WBC 6.2  HGB 14.5  HCT 40.5  PLT 217   BMET:  Recent Labs  08/31/14 1330  NA 139  K 4.4  CL 102  CO2 26  GLUCOSE 102*  BUN 13  CREATININE 0.83  CALCIUM 9.6    PT/INR:  Recent Labs  08/31/14 1330  LABPROT 13.8  INR 1.06   ABG    Component Value Date/Time   PHART 7.428 09/01/2014 0913   HCO3 26.0* 09/01/2014 0913   TCO2 27.3 09/01/2014 0913   O2SAT 94.7 09/01/2014 0913   CBG (last 3)   Recent Labs  08/31/14 2151 09/01/14 0621 09/01/14 1305  GLUCAP 106* 107* 106*    Assessment/Plan: S/P Procedure(s) (LRB): LEFT HEART CATHETERIZATION WITH CORONARY ANGIOGRAM (N/A) For CABG x 2, Bilateral IMA in AM All questions answered Does not need PFTs Labs OK   LOS: 1 day    HENDRICKSON,STEVEN C 09/01/2014

## 2014-09-01 NOTE — Progress Notes (Signed)
TR BAND REMOVAL  LOCATION:    right radial  DEFLATED PER PROTOCOL:    Yes.    TIME BAND OFF / DRESSING APPLIED:    1930   SITE UPON ARRIVAL:    Level 0  SITE AFTER BAND REMOVAL:    Level 0  REVERSE ALLEN'S TEST:     positive  CIRCULATION SENSATION AND MOVEMENT:    Within Normal Limits   Yes.    COMMENTS:    

## 2014-09-01 NOTE — Care Management Note (Addendum)
    Page 1 of 1   09/06/2014     4:19:06 PM CARE MANAGEMENT NOTE 09/06/2014  Patient:  Michael Duran,Michael Duran   Account Number:  [id number]  Date Initiated:  09/01/2014  Documentation initiated by:  HUTCHINSON,CRYSTAL  Subjective/Objective Assessment:   PROCEDURE: 1. Left heart catheterization; 2. Coronary angiography; 3. Left ventriculography     Action/Plan:   CM to follow for dispostion needs   Anticipated DC Date:  09/07/2014   Anticipated DC Plan:  HOME/SELF CARE      DC Planning Services  CM consult      Choice offered to / List presented to:             Status of service:  Completed, signed off Medicare Important Message given?  NA - LOS <3 / Initial given by admissions (If response is "NO", the following Medicare IM given date fields will be blank) Date Medicare IM given:   Medicare IM given by:   Date Additional Medicare IM given:   Additional Medicare IM given by:    Discharge Disposition:  HOME/SELF CARE  Per UR Regulation:  Reviewed for med. necessity/level of care/duration of stay  If discussed at Long Length of Stay Meetings, dates discussed:    Comments:  09/06/14 Sidney AceJulie Bana Borgmeyer, RN, BSN [phone] Pt s/p CABG x2 on 10/9.  PTA, pt independent, lives with spouse.  Wife to provide 24h care at dc.  Will follow progress.  Crystal Hutchinson RN, BSN, MSHL, CCM  Nurse - Case Manager,  (Unit [phone])  [phone]  09/01/2014 Med Review:  No needs identified Dispo Plan:  Home self care.

## 2014-09-01 NOTE — Progress Notes (Signed)
Discussed with pt and wife sternal precautions, mobility and IS. Good reception. Gave OHS book, guideline and video to watch. All questions answered. [phone] Ethelda ChickKristan Ishani Goldwasser CES, ACSM 3:33 PM 09/01/2014

## 2014-09-01 NOTE — Progress Notes (Signed)
Patient Name: Michael PartridgeDonald E Pigeon Date of Encounter: 09/01/2014     Active Problems:   Abnormal nuclear stress test   Angina effort    SUBJECTIVE  Denies any CP or SOB. Some headache  CURRENT MEDS . aspirin EC  325 mg Oral Daily  . atorvastatin  80 mg Oral q1800  . enoxaparin (LOVENOX) injection  95 mg Subcutaneous Q12H  . hydrochlorothiazide  12.5 mg Oral Daily  . isosorbide mononitrate  60 mg Oral Daily  . losartan  100 mg Oral QHS  . metoprolol succinate  50 mg Oral Daily    OBJECTIVE  Filed Vitals:   08/31/14 2023 08/31/14 2100 08/31/14 2346 09/01/14 0436  BP: 123/80 115/84 119/67 109/74  Pulse: 62  54 53  Temp: 97.7 F (36.5 C)  97.8 F (36.6 C) 97.7 F (36.5 C)  TempSrc: Oral  Oral Oral  Resp: 20 18 18 16   Height:      Weight:    214 lb 1.1 oz (97.1 kg)  SpO2: 96% 96% 98% 96%    Intake/Output Summary (Last 24 hours) at 09/01/14 1032 Last data filed at 09/01/14 0900  Gross per 24 hour  Intake 828.75 ml  Output   1300 ml  Net -471.25 ml   Filed Weights   08/31/14 1229 09/01/14 0436  Weight: 209 lb (94.802 kg) 214 lb 1.1 oz (97.1 kg)    PHYSICAL EXAM  General: Pleasant, NAD. Neuro: Alert and oriented X 3. Moves all extremities spontaneously. Psych: Normal affect. HEENT:  Normal  Neck: Supple without bruits or JVD. Lungs:  Resp regular and unlabored, CTA. Heart: RRR no s3, s4, or murmurs. Abdomen: Soft, non-tender, non-distended, BS + x 4.  Extremities: No clubbing, cyanosis or edema. DP/PT/Radials 2+ and equal bilaterally.  Accessory Clinical Findings  CBC  Recent Labs  08/31/14 1330  WBC 6.2  HGB 14.5  HCT 40.5  MCV 80.5  PLT 217   Basic Metabolic Panel  Recent Labs  08/31/14 1330  NA 139  K 4.4  CL 102  CO2 26  GLUCOSE 102*  BUN 13  CREATININE 0.83  CALCIUM 9.6   Cardiac Enzymes  Recent Labs  08/31/14 1940  TROPONINI <0.30    TELE NSR with HR 50-60s, no significant ventricular ectopy    ECG  NSR with Q wave  in inferior lead   Radiology/Studies  No results found.  ASSESSMENT AND PLAN  1. Exertional CP with abnormal stress test  - Nuclear (10/15): Inferior and apical defect consistent with ischemia, cannot rule out TID, ECG changes and chest pain during Bruce protocol; high risk  - Cath 08/31/2014 severe 2 v dx, 80% prox to mid LAD, 75-80% distal LAD, 99% RCA before large PDA  - evaluated by Dr. Dorris FetchHendrickson of CT surgery for bypass given young age. Plan for CABG x 2 with bilateral mammary arteries on Friday 10/9 (likely need hold lovenox prior to surgery tomorrow)  - continue ASA, BB, lipitor  2. HTN  - BP was high in 170-180s, losartan 100mg  and HCTZ 12.5mg  added last night, now BP low 100-110s, would consider d/c HCTZ or cut back on losartan before the surgery  3. DM 4. HLD 5. OSA   Signed, Azalee CourseMeng, Hao PA-C Pager: [phone]  I have seen and examined the patient along with Azalee CourseHao Meng, PAC.  I have reviewed the chart, notes and new data.  I agree with PA's note.  PLAN: Asymptomatic severe CAD for CABG in AM. Discussed surgery and recovery/rehab  in detail. Stop HCTZ.  Thurmon Fair, MD, [facility] [phone] 09/01/2014, 11:41 AM

## 2014-09-01 NOTE — Progress Notes (Addendum)
VASCULAR LAB PRELIMINARY  PRELIMINARY  PRELIMINARY  PRELIMINARY  Pre-op Cardiac Surgery  Carotid Findings:  Bilateral:  1-39% ICA stenosis.  Vertebral artery flow is antegrade.    Thereasa ParkinHelene Cestone, RVT 09/01/2014 2:25 PM      Upper Extremity Right Left  Brachial Pressures 114 Triphasic 107 Triphasic  Radial Waveforms Triphasic Triphasic  Ulnar Waveforms Triphasic Triphasic  Palmar Arch (Allen's Test) Normal Normal   Findings:  Doppler waveforms remained normal bilaterally with both radial and ulnar compressions.    Lower  Extremity Right Left  Dorsalis Pedis    Anterior Tibial    Posterior Tibial    Ankle/Brachial Indices      Findings:  Strong palpable pedal pulses bilaterally   Graybar ElectricVirginia Earley Grobe, RVS 09/01/2014 11:21 PM

## 2014-09-02 ENCOUNTER — Inpatient Hospital Stay (HOSPITAL_COMMUNITY): Payer: Managed Care, Other (non HMO) | Admitting: Certified Registered"

## 2014-09-02 ENCOUNTER — Inpatient Hospital Stay (HOSPITAL_COMMUNITY): Payer: Managed Care, Other (non HMO)

## 2014-09-02 ENCOUNTER — Encounter (HOSPITAL_COMMUNITY): Payer: Managed Care, Other (non HMO) | Admitting: Certified Registered"

## 2014-09-02 ENCOUNTER — Encounter (HOSPITAL_COMMUNITY)
Admission: AD | Disposition: A | Discharge status: Home / Self Care | Payer: Managed Care, Other (non HMO) | Source: Ambulatory Visit | Attending: Thoracic Surgery (Cardiothoracic Vascular Surgery)

## 2014-09-02 ENCOUNTER — Encounter (HOSPITAL_COMMUNITY): Payer: Self-pay | Admitting: Certified Registered"

## 2014-09-02 DIAGNOSIS — Z951 Presence of aortocoronary bypass graft: Secondary | ICD-10-CM

## 2014-09-02 HISTORY — PX: CORONARY ARTERY BYPASS GRAFT: SHX141

## 2014-09-02 HISTORY — PX: TEE WITHOUT CARDIOVERSION: SHX5443

## 2014-09-02 LAB — POCT I-STAT 3, ART BLOOD GAS (G3+)
ACID-BASE EXCESS: 3 mmol/L — AB (ref 0.0–2.0)
Acid-base deficit: 1 mmol/L (ref 0.0–2.0)
Bicarbonate: 28 mEq/L — ABNORMAL HIGH (ref 20.0–24.0)
Bicarbonate: 25.3 mEq/L — ABNORMAL HIGH (ref 20.0–24.0)
Bicarbonate: 25.3 mEq/L — ABNORMAL HIGH (ref 20.0–24.0)
Bicarbonate: 26.7 mEq/L — ABNORMAL HIGH (ref 20.0–24.0)
O2 SAT: 98 %
O2 SAT: 98 %
O2 SAT: 98 %
O2 Saturation: 100 %
PCO2 ART: 46.2 mmHg — AB (ref 35.0–45.0)
PH ART: 7.347 — AB (ref 7.350–7.450)
PO2 ART: 107 mmHg — AB (ref 80.0–100.0)
PO2 ART: 111 mmHg — AB (ref 80.0–100.0)
Patient temperature: 38.2
Patient temperature: 38.1
TCO2: 29 mmol/L (ref 0–100)
TCO2: 27 mmol/L (ref 0–100)
TCO2: 27 mmol/L (ref 0–100)
TCO2: 28 mmol/L (ref 0–100)
pCO2 arterial: 45.1 mmHg — ABNORMAL HIGH (ref 35.0–45.0)
pCO2 arterial: 47.6 mmHg — ABNORMAL HIGH (ref 35.0–45.0)
pCO2 arterial: 54.8 mmHg — ABNORMAL HIGH (ref 35.0–45.0)
pH, Arterial: 7.4 (ref 7.350–7.450)
pH, Arterial: 7.339 — ABNORMAL LOW (ref 7.350–7.450)
pH, Arterial: 7.301 — ABNORMAL LOW (ref 7.350–7.450)
pO2, Arterial: 320 mmHg — ABNORMAL HIGH (ref 80.0–100.0)
pO2, Arterial: 116 mmHg — ABNORMAL HIGH (ref 80.0–100.0)

## 2014-09-02 LAB — POCT I-STAT, CHEM 8
BUN: 11 mg/dL (ref 6–23)
BUN: 10 mg/dL (ref 6–23)
BUN: 9 mg/dL (ref 6–23)
BUN: 9 mg/dL (ref 6–23)
BUN: 9 mg/dL (ref 6–23)
CALCIUM ION: 1.28 mmol/L — AB (ref 1.12–1.23)
CALCIUM ION: 1.28 mmol/L — AB (ref 1.12–1.23)
CALCIUM ION: 1.16 mmol/L (ref 1.12–1.23)
CHLORIDE: 102 meq/L (ref 96–112)
CHLORIDE: 100 meq/L (ref 96–112)
CHLORIDE: 102 meq/L (ref 96–112)
CREATININE: 0.7 mg/dL (ref 0.50–1.35)
Calcium, Ion: 1.04 mmol/L — ABNORMAL LOW (ref 1.12–1.23)
Calcium, Ion: 1.19 mmol/L (ref 1.12–1.23)
Chloride: 101 mEq/L (ref 96–112)
Chloride: 104 mEq/L (ref 96–112)
Creatinine, Ser: 0.7 mg/dL (ref 0.50–1.35)
Creatinine, Ser: 0.7 mg/dL (ref 0.50–1.35)
Creatinine, Ser: 0.5 mg/dL (ref 0.50–1.35)
Creatinine, Ser: 0.7 mg/dL (ref 0.50–1.35)
GLUCOSE: 117 mg/dL — AB (ref 70–99)
Glucose, Bld: 148 mg/dL — ABNORMAL HIGH (ref 70–99)
Glucose, Bld: 127 mg/dL — ABNORMAL HIGH (ref 70–99)
Glucose, Bld: 134 mg/dL — ABNORMAL HIGH (ref 70–99)
Glucose, Bld: 111 mg/dL — ABNORMAL HIGH (ref 70–99)
HEMATOCRIT: 37 % — AB (ref 39.0–52.0)
HEMATOCRIT: 33 % — AB (ref 39.0–52.0)
HEMATOCRIT: 28 % — AB (ref 39.0–52.0)
HEMATOCRIT: 30 % — AB (ref 39.0–52.0)
HEMATOCRIT: 31 % — AB (ref 39.0–52.0)
HEMOGLOBIN: 11.2 g/dL — AB (ref 13.0–17.0)
Hemoglobin: 12.6 g/dL — ABNORMAL LOW (ref 13.0–17.0)
Hemoglobin: 9.5 g/dL — ABNORMAL LOW (ref 13.0–17.0)
Hemoglobin: 10.2 g/dL — ABNORMAL LOW (ref 13.0–17.0)
Hemoglobin: 10.5 g/dL — ABNORMAL LOW (ref 13.0–17.0)
POTASSIUM: 4.2 meq/L (ref 3.7–5.3)
POTASSIUM: 4.2 meq/L (ref 3.7–5.3)
Potassium: 3.9 mEq/L (ref 3.7–5.3)
Potassium: 4.2 mEq/L (ref 3.7–5.3)
Potassium: 4.1 mEq/L (ref 3.7–5.3)
SODIUM: 139 meq/L (ref 137–147)
SODIUM: 138 meq/L (ref 137–147)
SODIUM: 139 meq/L (ref 137–147)
Sodium: 140 mEq/L (ref 137–147)
Sodium: 139 mEq/L (ref 137–147)
TCO2: 24 mmol/L (ref 0–100)
TCO2: 24 mmol/L (ref 0–100)
TCO2: 27 mmol/L (ref 0–100)
TCO2: 22 mmol/L (ref 0–100)
TCO2: 23 mmol/L (ref 0–100)

## 2014-09-02 LAB — CBC
HEMATOCRIT: 33.9 % — AB (ref 39.0–52.0)
HEMATOCRIT: 31.7 % — AB (ref 39.0–52.0)
HEMATOCRIT: 35.8 % — AB (ref 39.0–52.0)
HEMOGLOBIN: 11.3 g/dL — AB (ref 13.0–17.0)
Hemoglobin: 12.9 g/dL — ABNORMAL LOW (ref 13.0–17.0)
Hemoglobin: 12.1 g/dL — ABNORMAL LOW (ref 13.0–17.0)
MCH: 29.3 pg (ref 26.0–34.0)
MCH: 29.5 pg (ref 26.0–34.0)
MCH: 28.9 pg (ref 26.0–34.0)
MCHC: 36 g/dL (ref 30.0–36.0)
MCHC: 35.7 g/dL (ref 30.0–36.0)
MCHC: 35.6 g/dL (ref 30.0–36.0)
MCV: 81.4 fL (ref 78.0–100.0)
MCV: 82.7 fL (ref 78.0–100.0)
MCV: 81.1 fL (ref 78.0–100.0)
PLATELETS: 201 10*3/uL (ref 150–400)
Platelets: 168 10*3/uL (ref 150–400)
Platelets: 150 10*3/uL (ref 150–400)
RBC: 4.4 MIL/uL (ref 4.22–5.81)
RBC: 4.1 MIL/uL — AB (ref 4.22–5.81)
RBC: 3.91 MIL/uL — ABNORMAL LOW (ref 4.22–5.81)
RDW: 12.9 % (ref 11.5–15.5)
RDW: 12.9 % (ref 11.5–15.5)
RDW: 12.9 % (ref 11.5–15.5)
WBC: 12.3 10*3/uL — AB (ref 4.0–10.5)
WBC: 9 10*3/uL (ref 4.0–10.5)
WBC: 11.3 10*3/uL — ABNORMAL HIGH (ref 4.0–10.5)

## 2014-09-02 LAB — GLUCOSE, CAPILLARY
GLUCOSE-CAPILLARY: 119 mg/dL — AB (ref 70–99)
GLUCOSE-CAPILLARY: 113 mg/dL — AB (ref 70–99)
GLUCOSE-CAPILLARY: 111 mg/dL — AB (ref 70–99)
Glucose-Capillary: 100 mg/dL — ABNORMAL HIGH (ref 70–99)
Glucose-Capillary: 102 mg/dL — ABNORMAL HIGH (ref 70–99)
Glucose-Capillary: 107 mg/dL — ABNORMAL HIGH (ref 70–99)
Glucose-Capillary: 115 mg/dL — ABNORMAL HIGH (ref 70–99)
Glucose-Capillary: 117 mg/dL — ABNORMAL HIGH (ref 70–99)
Glucose-Capillary: 120 mg/dL — ABNORMAL HIGH (ref 70–99)
Glucose-Capillary: 121 mg/dL — ABNORMAL HIGH (ref 70–99)
Glucose-Capillary: 125 mg/dL — ABNORMAL HIGH (ref 70–99)
Glucose-Capillary: 98 mg/dL (ref 70–99)

## 2014-09-02 LAB — POCT I-STAT 4, (NA,K, GLUC, HGB,HCT)
Glucose, Bld: 137 mg/dL — ABNORMAL HIGH (ref 70–99)
HCT: 33 % — ABNORMAL LOW (ref 39.0–52.0)
Hemoglobin: 11.2 g/dL — ABNORMAL LOW (ref 13.0–17.0)
Potassium: 4 mEq/L (ref 3.7–5.3)
SODIUM: 138 meq/L (ref 137–147)

## 2014-09-02 LAB — HEMOGLOBIN A1C
HEMOGLOBIN A1C: 5.4 % (ref <5.7)
Mean Plasma Glucose: 108 mg/dL (ref <117)

## 2014-09-02 LAB — HEMOGLOBIN AND HEMATOCRIT, BLOOD
HCT: 28.7 % — ABNORMAL LOW (ref 39.0–52.0)
Hemoglobin: 10.4 g/dL — ABNORMAL LOW (ref 13.0–17.0)

## 2014-09-02 LAB — PROTIME-INR
INR: 1.37 (ref 0.00–1.49)
Prothrombin Time: 16.9 seconds — ABNORMAL HIGH (ref 11.6–15.2)

## 2014-09-02 LAB — PLATELET COUNT: Platelets: 217 10*3/uL (ref 150–400)

## 2014-09-02 LAB — CREATININE, SERUM
Creatinine, Ser: 0.75 mg/dL (ref 0.50–1.35)
GFR calc Af Amer: >90 mL/min (ref >90)

## 2014-09-02 LAB — APTT
APTT: 34 s (ref 24–37)
aPTT: 40 seconds — ABNORMAL HIGH (ref 24–37)

## 2014-09-02 LAB — POCT I-STAT GLUCOSE
Glucose, Bld: 127 mg/dL — ABNORMAL HIGH (ref 70–99)
OPERATOR ID: 284731

## 2014-09-02 LAB — MAGNESIUM: Magnesium: 2.4 mg/dL (ref 1.5–2.5)

## 2014-09-02 SURGERY — CORONARY ARTERY BYPASS GRAFTING (CABG)
Anesthesia: General | Site: Chest

## 2014-09-02 MED ORDER — POTASSIUM CHLORIDE 10 MEQ/50ML IV SOLN: 10.0000 meq | INTRAVENOUS | Status: AC

## 2014-09-02 MED ORDER — DEXMEDETOMIDINE HCL IN NACL 200 MCG/50ML IV SOLN
0.0000 ug/kg/h | INTRAVENOUS | Status: DC
  Filled 2014-09-02: qty 50

## 2014-09-02 MED ORDER — SODIUM CHLORIDE 0.9 % IJ SOLN
3.0000 mL | Freq: Two times a day (BID) | INTRAMUSCULAR | Status: DC
  Administered 2014-09-03 – 2014-09-04 (×2): 3 mL via INTRAVENOUS

## 2014-09-02 MED ORDER — SODIUM CHLORIDE 0.9 % IJ SOLN
INTRAMUSCULAR | Status: AC
  Filled 2014-09-02: qty 10

## 2014-09-02 MED ORDER — METOPROLOL TARTRATE 1 MG/ML IV SOLN: 2.5000 mg | INTRAVENOUS | Status: DC | PRN

## 2014-09-02 MED ORDER — ROCURONIUM BROMIDE 100 MG/10ML IV SOLN
INTRAVENOUS | Status: DC | PRN
  Administered 2014-09-02: 40 mg via INTRAVENOUS
  Administered 2014-09-02 (×2): 50 mg via INTRAVENOUS
  Administered 2014-09-02: 60 mg via INTRAVENOUS
  Administered 2014-09-02: 50 mg via INTRAVENOUS

## 2014-09-02 MED ORDER — PROPOFOL 10 MG/ML IV BOLUS
INTRAVENOUS | Status: AC
  Filled 2014-09-02: qty 20

## 2014-09-02 MED ORDER — FENTANYL CITRATE 0.05 MG/ML IJ SOLN
INTRAMUSCULAR | Status: AC
  Filled 2014-09-02: qty 5

## 2014-09-02 MED ORDER — VECURONIUM BROMIDE 10 MG IV SOLR
INTRAVENOUS | Status: AC
  Filled 2014-09-02: qty 10

## 2014-09-02 MED ORDER — MIDAZOLAM HCL 2 MG/2ML IJ SOLN
INTRAMUSCULAR | Status: AC
  Filled 2014-09-02: qty 2

## 2014-09-02 MED ORDER — DOCUSATE SODIUM 100 MG PO CAPS
200.0000 mg | ORAL_CAPSULE | Freq: Every day | ORAL | Status: DC
  Administered 2014-09-03 – 2014-09-06 (×4): 200 mg via ORAL
  Filled 2014-09-02 (×6): qty 2

## 2014-09-02 MED ORDER — ARTIFICIAL TEARS OP OINT
TOPICAL_OINTMENT | OPHTHALMIC | Status: AC
  Filled 2014-09-02: qty 3.5

## 2014-09-02 MED ORDER — LACTATED RINGERS IV SOLN: INTRAVENOUS | Status: DC

## 2014-09-02 MED ORDER — MAGNESIUM SULFATE 4000MG/100ML IJ SOLN
4.0000 g | Freq: Once | INTRAMUSCULAR | Status: AC
Start: 2014-09-02 — End: 2014-09-02
  Administered 2014-09-02: 4 g via INTRAVENOUS
  Filled 2014-09-02: qty 100

## 2014-09-02 MED ORDER — MORPHINE SULFATE 2 MG/ML IJ SOLN
1.0000 mg | INTRAMUSCULAR | Status: AC | PRN
Start: 2014-09-02 — End: 2014-09-03
  Administered 2014-09-02: 2 mg via INTRAVENOUS
  Administered 2014-09-02: 4 mg via INTRAVENOUS
  Filled 2014-09-02: qty 1
  Filled 2014-09-02: qty 2

## 2014-09-02 MED ORDER — MIDAZOLAM HCL 5 MG/5ML IJ SOLN
INTRAMUSCULAR | Status: DC | PRN
  Administered 2014-09-02 (×2): 3 mg via INTRAVENOUS
  Administered 2014-09-02 (×4): 2 mg via INTRAVENOUS

## 2014-09-02 MED ORDER — ASPIRIN 81 MG PO CHEW
324.0000 mg | CHEWABLE_TABLET | Freq: Every day | ORAL | Status: DC
  Administered 2014-09-06: 324 mg
  Filled 2014-09-02: qty 4

## 2014-09-02 MED ORDER — ACETAMINOPHEN 160 MG/5ML PO SOLN
1000.0000 mg | Freq: Four times a day (QID) | ORAL | Status: DC
  Filled 2014-09-02: qty 40

## 2014-09-02 MED ORDER — SODIUM CHLORIDE 0.9 % IV SOLN
INTRAVENOUS | Status: DC | PRN
  Administered 2014-09-02: 12:00:00 via INTRAVENOUS

## 2014-09-02 MED ORDER — MIDAZOLAM HCL 2 MG/2ML IJ SOLN
2.0000 mg | INTRAMUSCULAR | Status: DC | PRN
  Administered 2014-09-02: 2 mg via INTRAVENOUS
  Filled 2014-09-02: qty 2

## 2014-09-02 MED ORDER — INSULIN REGULAR BOLUS VIA INFUSION
0.0000 [IU] | Freq: Three times a day (TID) | INTRAVENOUS | Status: DC
  Filled 2014-09-02: qty 10

## 2014-09-02 MED ORDER — BISACODYL 10 MG RE SUPP
10.0000 mg | Freq: Every day | RECTAL | Status: DC
  Administered 2014-09-05: 10 mg via RECTAL
  Filled 2014-09-02: qty 1

## 2014-09-02 MED ORDER — VANCOMYCIN HCL IN DEXTROSE 1-5 GM/200ML-% IV SOLN
1000.0000 mg | Freq: Once | INTRAVENOUS | Status: AC
  Administered 2014-09-02: 1000 mg via INTRAVENOUS
  Filled 2014-09-02: qty 200

## 2014-09-02 MED ORDER — SODIUM CHLORIDE 0.45 % IV SOLN
INTRAVENOUS | Status: DC
  Administered 2014-09-02: 13:00:00 via INTRAVENOUS

## 2014-09-02 MED ORDER — METOPROLOL TARTRATE 25 MG/10 ML ORAL SUSPENSION
12.5000 mg | Freq: Two times a day (BID) | ORAL | Status: DC
  Filled 2014-09-02 (×5): qty 5

## 2014-09-02 MED ORDER — ROCURONIUM BROMIDE 50 MG/5ML IV SOLN
INTRAVENOUS | Status: AC
  Filled 2014-09-02: qty 1

## 2014-09-02 MED ORDER — NITROGLYCERIN IN D5W 200-5 MCG/ML-% IV SOLN: 0.0000 ug/min | INTRAVENOUS | Status: DC

## 2014-09-02 MED ORDER — SODIUM CHLORIDE 0.9 % IV SOLN: INTRAVENOUS | Status: DC

## 2014-09-02 MED ORDER — OXYCODONE HCL 5 MG PO TABS
5.0000 mg | ORAL_TABLET | ORAL | Status: DC | PRN
  Administered 2014-09-02 – 2014-09-06 (×14): 10 mg via ORAL
  Administered 2014-09-07: 5 mg via ORAL
  Filled 2014-09-02 (×2): qty 2
  Filled 2014-09-02: qty 1
  Filled 2014-09-02 (×12): qty 2

## 2014-09-02 MED ORDER — FAMOTIDINE IN NACL 20-0.9 MG/50ML-% IV SOLN
20.0000 mg | Freq: Two times a day (BID) | INTRAVENOUS | Status: AC
  Administered 2014-09-02 (×2): 20 mg via INTRAVENOUS
  Filled 2014-09-02: qty 50

## 2014-09-02 MED ORDER — CETYLPYRIDINIUM CHLORIDE 0.05 % MT LIQD
7.0000 mL | Freq: Four times a day (QID) | OROMUCOSAL | Status: DC
  Administered 2014-09-02 – 2014-09-03 (×3): 7 mL via OROMUCOSAL

## 2014-09-02 MED ORDER — STERILE WATER FOR INJECTION IJ SOLN
INTRAMUSCULAR | Status: AC
  Filled 2014-09-02: qty 10

## 2014-09-02 MED ORDER — ACETAMINOPHEN 500 MG PO TABS
1000.0000 mg | ORAL_TABLET | Freq: Four times a day (QID) | ORAL | Status: DC
  Administered 2014-09-02 – 2014-09-07 (×16): 1000 mg via ORAL
  Filled 2014-09-02 (×23): qty 2

## 2014-09-02 MED ORDER — SODIUM CHLORIDE 0.9 % IJ SOLN
OROMUCOSAL | Status: DC | PRN
  Administered 2014-09-02 (×3): via TOPICAL

## 2014-09-02 MED ORDER — ACETAMINOPHEN 160 MG/5ML PO SOLN: 650.0000 mg | Freq: Once | ORAL | Status: AC

## 2014-09-02 MED ORDER — 0.9 % SODIUM CHLORIDE (POUR BTL) OPTIME
TOPICAL | Status: DC | PRN
  Administered 2014-09-02: 6000 mL

## 2014-09-02 MED ORDER — CHLORHEXIDINE GLUCONATE 0.12 % MT SOLN: 15.0000 mL | Freq: Two times a day (BID) | OROMUCOSAL | Status: DC

## 2014-09-02 MED ORDER — ACETAMINOPHEN 650 MG RE SUPP
650.0000 mg | Freq: Once | RECTAL | Status: AC
  Administered 2014-09-02: 650 mg via RECTAL
  Filled 2014-09-02: qty 1

## 2014-09-02 MED ORDER — BISACODYL 5 MG PO TBEC
10.0000 mg | DELAYED_RELEASE_TABLET | Freq: Every day | ORAL | Status: DC
  Administered 2014-09-03 – 2014-09-06 (×3): 10 mg via ORAL
  Filled 2014-09-02 (×4): qty 2

## 2014-09-02 MED ORDER — HEMOSTATIC AGENTS (NO CHARGE) OPTIME
TOPICAL | Status: DC | PRN
  Administered 2014-09-02: 1 via TOPICAL

## 2014-09-02 MED ORDER — ONDANSETRON HCL 4 MG/2ML IJ SOLN
4.0000 mg | Freq: Four times a day (QID) | INTRAMUSCULAR | Status: DC | PRN
Start: 2014-09-02 — End: 2014-09-07
  Administered 2014-09-03: 4 mg via INTRAVENOUS
  Filled 2014-09-02: qty 2

## 2014-09-02 MED ORDER — ROCURONIUM BROMIDE 50 MG/5ML IV SOLN
INTRAVENOUS | Status: AC
  Filled 2014-09-02: qty 2

## 2014-09-02 MED ORDER — PROTAMINE SULFATE 10 MG/ML IV SOLN
INTRAVENOUS | Status: DC | PRN
  Administered 2014-09-02: 50 mg via INTRAVENOUS
  Administered 2014-09-02: 100 mg via INTRAVENOUS
  Administered 2014-09-02 (×3): 50 mg via INTRAVENOUS

## 2014-09-02 MED ORDER — LACTATED RINGERS IV SOLN: 500.0000 mL | Freq: Once | INTRAVENOUS | Status: AC | PRN

## 2014-09-02 MED ORDER — FENTANYL CITRATE 0.05 MG/ML IJ SOLN
INTRAMUSCULAR | Status: DC | PRN
  Administered 2014-09-02: 50 ug via INTRAVENOUS
  Administered 2014-09-02: 250 ug via INTRAVENOUS
  Administered 2014-09-02: 50 ug via INTRAVENOUS
  Administered 2014-09-02: 850 ug via INTRAVENOUS
  Administered 2014-09-02 (×2): 250 ug via INTRAVENOUS
  Administered 2014-09-02: 50 ug via INTRAVENOUS

## 2014-09-02 MED ORDER — MIDAZOLAM HCL 10 MG/2ML IJ SOLN
INTRAMUSCULAR | Status: AC
  Filled 2014-09-02: qty 2

## 2014-09-02 MED ORDER — ALBUMIN HUMAN 5 % IV SOLN
250.0000 mL | INTRAVENOUS | Status: AC | PRN
  Administered 2014-09-02 (×2): 250 mL via INTRAVENOUS

## 2014-09-02 MED ORDER — ASPIRIN EC 325 MG PO TBEC
325.0000 mg | DELAYED_RELEASE_TABLET | Freq: Every day | ORAL | Status: DC
  Administered 2014-09-03 – 2014-09-07 (×4): 325 mg via ORAL
  Filled 2014-09-02 (×5): qty 1

## 2014-09-02 MED ORDER — SODIUM CHLORIDE 0.9 % IV SOLN
INTRAVENOUS | Status: DC
  Filled 2014-09-02: qty 2.5

## 2014-09-02 MED ORDER — MORPHINE SULFATE 2 MG/ML IJ SOLN
2.0000 mg | INTRAMUSCULAR | Status: DC | PRN
  Administered 2014-09-02 – 2014-09-03 (×8): 4 mg via INTRAVENOUS
  Filled 2014-09-02 (×8): qty 2

## 2014-09-02 MED ORDER — KETOROLAC TROMETHAMINE 30 MG/ML IJ SOLN
30.0000 mg | Freq: Four times a day (QID) | INTRAMUSCULAR | Status: DC
  Administered 2014-09-02 – 2014-09-03 (×5): 30 mg via INTRAVENOUS
  Filled 2014-09-02 (×5): qty 1

## 2014-09-02 MED ORDER — SODIUM CHLORIDE 0.9 % IV SOLN: 250.0000 mL | INTRAVENOUS | Status: DC

## 2014-09-02 MED ORDER — MIDAZOLAM HCL 2 MG/2ML IJ SOLN
INTRAMUSCULAR | Status: AC
Start: 2014-09-02 — End: 2014-09-02
  Filled 2014-09-02: qty 2

## 2014-09-02 MED ORDER — SODIUM CHLORIDE 0.9 % IJ SOLN: 3.0000 mL | INTRAMUSCULAR | Status: DC | PRN

## 2014-09-02 MED ORDER — LACTATED RINGERS IV SOLN
INTRAVENOUS | Status: DC | PRN
  Administered 2014-09-02 (×4): via INTRAVENOUS

## 2014-09-02 MED ORDER — ALBUMIN HUMAN 5 % IV SOLN
INTRAVENOUS | Status: DC | PRN
  Administered 2014-09-02: 13:00:00 via INTRAVENOUS

## 2014-09-02 MED ORDER — PANTOPRAZOLE SODIUM 40 MG PO TBEC
40.0000 mg | DELAYED_RELEASE_TABLET | Freq: Every day | ORAL | Status: DC
  Administered 2014-09-04 – 2014-09-07 (×4): 40 mg via ORAL
  Filled 2014-09-02 (×3): qty 1

## 2014-09-02 MED ORDER — PROPOFOL 10 MG/ML IV BOLUS
INTRAVENOUS | Status: DC | PRN
  Administered 2014-09-02: 50 mg via INTRAVENOUS

## 2014-09-02 MED ORDER — PHENYLEPHRINE HCL 10 MG/ML IJ SOLN
0.0000 ug/min | INTRAMUSCULAR | Status: DC
  Filled 2014-09-02: qty 2

## 2014-09-02 MED ORDER — TRAMADOL HCL 50 MG PO TABS
50.0000 mg | ORAL_TABLET | ORAL | Status: DC | PRN
  Administered 2014-09-03: 100 mg via ORAL
  Filled 2014-09-02: qty 2

## 2014-09-02 MED ORDER — DEXTROSE 5 % IV SOLN
1.5000 g | Freq: Two times a day (BID) | INTRAVENOUS | Status: AC
  Administered 2014-09-02 – 2014-09-04 (×4): 1.5 g via INTRAVENOUS
  Filled 2014-09-02 (×4): qty 1.5

## 2014-09-02 MED ORDER — HEPARIN SODIUM (PORCINE) 1000 UNIT/ML IJ SOLN
INTRAMUSCULAR | Status: DC | PRN
  Administered 2014-09-02: 25000 [IU] via INTRAVENOUS
  Administered 2014-09-02: 5000 [IU] via INTRAVENOUS

## 2014-09-02 MED ORDER — EPHEDRINE SULFATE 50 MG/ML IJ SOLN
INTRAMUSCULAR | Status: AC
  Filled 2014-09-02: qty 1

## 2014-09-02 MED ORDER — ARTIFICIAL TEARS OP OINT
TOPICAL_OINTMENT | OPHTHALMIC | Status: DC | PRN
  Administered 2014-09-02: 1 via OPHTHALMIC

## 2014-09-02 MED ORDER — METOPROLOL TARTRATE 12.5 MG HALF TABLET
12.5000 mg | ORAL_TABLET | Freq: Two times a day (BID) | ORAL | Status: DC
  Filled 2014-09-02 (×5): qty 1

## 2014-09-02 MED ORDER — VECURONIUM BROMIDE 10 MG IV SOLR
INTRAVENOUS | Status: DC | PRN
  Administered 2014-09-02 (×2): 5 mg via INTRAVENOUS

## 2014-09-02 MED ORDER — CHLORHEXIDINE GLUCONATE 0.12 % MT SOLN
15.0000 mL | Freq: Two times a day (BID) | OROMUCOSAL | Status: DC
  Administered 2014-09-02 – 2014-09-03 (×2): 15 mL via OROMUCOSAL
  Filled 2014-09-02 (×2): qty 15

## 2014-09-02 SURGICAL SUPPLY — 98 items
ATTRACTOMAT 16X20 MAGNETIC DRP (DRAPES) ×3 IMPLANT
BAG DECANTER FOR FLEXI CONT (MISCELLANEOUS) ×3 IMPLANT
BANDAGE ELASTIC 4 VELCRO ST LF (GAUZE/BANDAGES/DRESSINGS) IMPLANT
BANDAGE ELASTIC 6 VELCRO ST LF (GAUZE/BANDAGES/DRESSINGS) IMPLANT
BASKET HEART (ORDER IN 25'S) (MISCELLANEOUS) ×1
BASKET HEART (ORDER IN 25S) (MISCELLANEOUS) ×2 IMPLANT
BLADE STERNUM SYSTEM 6 (BLADE) ×3 IMPLANT
BLADE SURG 10 STRL SS (BLADE) ×6 IMPLANT
BNDG GAUZE ELAST 4 BULKY (GAUZE/BANDAGES/DRESSINGS) IMPLANT
CANISTER SUCTION 2500CC (MISCELLANEOUS) ×3 IMPLANT
CANNULA EZ GLIDE AORTIC 21FR (CANNULA) ×3 IMPLANT
CARDIAC SUCTION (MISCELLANEOUS) ×3 IMPLANT
CATH CPB KIT HENDRICKSON (MISCELLANEOUS) ×3 IMPLANT
CATH ROBINSON RED A/P 18FR (CATHETERS) ×3 IMPLANT
CATH THORACIC 36FR (CATHETERS) ×3 IMPLANT
CATH THORACIC 36FR RT ANG (CATHETERS) IMPLANT
CLIP TI MEDIUM 24 (CLIP) ×6 IMPLANT
CLIP TI WIDE RED SMALL 24 (CLIP) ×21 IMPLANT
CONN ST 1/4X3/8  BEN (MISCELLANEOUS) ×1
CONN ST 1/4X3/8 BEN (MISCELLANEOUS) ×2 IMPLANT
COVER SURGICAL LIGHT HANDLE (MISCELLANEOUS) ×3 IMPLANT
CRADLE DONUT ADULT HEAD (MISCELLANEOUS) ×3 IMPLANT
DRAIN CHANNEL 32F RND 10.7 FF (WOUND CARE) ×6 IMPLANT
DRAPE CARDIOVASCULAR INCISE (DRAPES) ×1
DRAPE SLUSH/WARMER DISC (DRAPES) ×3 IMPLANT
DRAPE SRG 135X102X78XABS (DRAPES) ×2 IMPLANT
DRSG COVADERM 4X14 (GAUZE/BANDAGES/DRESSINGS) ×3 IMPLANT
ELECT REM PT RETURN 9FT ADLT (ELECTROSURGICAL) ×6
ELECTRODE REM PT RTRN 9FT ADLT (ELECTROSURGICAL) ×4 IMPLANT
GAUZE SPONGE 4X4 12PLY STRL (GAUZE/BANDAGES/DRESSINGS) ×3 IMPLANT
GLOVE BIO SURGEON STRL SZ 6.5 (GLOVE) ×6 IMPLANT
GLOVE BIO SURGEON STRL SZ7.5 (GLOVE) ×3 IMPLANT
GLOVE BIO SURGEON STRL SZ8.5 (GLOVE) ×3 IMPLANT
GLOVE BIOGEL PI IND STRL 6 (GLOVE) ×2 IMPLANT
GLOVE BIOGEL PI IND STRL 6.5 (GLOVE) ×4 IMPLANT
GLOVE BIOGEL PI IND STRL 7.0 (GLOVE) ×4 IMPLANT
GLOVE BIOGEL PI INDICATOR 6 (GLOVE) ×1
GLOVE BIOGEL PI INDICATOR 6.5 (GLOVE) ×2
GLOVE BIOGEL PI INDICATOR 7.0 (GLOVE) ×2
GLOVE SURG SIGNA 7.5 PF LTX (GLOVE) ×9 IMPLANT
GOWN STRL REUS W/ TWL LRG LVL3 (GOWN DISPOSABLE) ×10 IMPLANT
GOWN STRL REUS W/ TWL XL LVL3 (GOWN DISPOSABLE) ×4 IMPLANT
GOWN STRL REUS W/TWL LRG LVL3 (GOWN DISPOSABLE) ×5
GOWN STRL REUS W/TWL XL LVL3 (GOWN DISPOSABLE) ×2
HEMOSTAT POWDER SURGIFOAM 1G (HEMOSTASIS) ×9 IMPLANT
HEMOSTAT SURGICEL 2X14 (HEMOSTASIS) ×3 IMPLANT
INSERT FOGARTY XLG (MISCELLANEOUS) IMPLANT
KIT BASIN OR (CUSTOM PROCEDURE TRAY) ×3 IMPLANT
KIT ROOM TURNOVER OR (KITS) ×3 IMPLANT
KIT SUCTION CATH 14FR (SUCTIONS) ×6 IMPLANT
KIT VASOVIEW W/TROCAR VH 2000 (KITS) ×3 IMPLANT
MARKER GRAFT CORONARY BYPASS (MISCELLANEOUS) ×12 IMPLANT
NS IRRIG 1000ML POUR BTL (IV SOLUTION) ×18 IMPLANT
PACK OPEN HEART (CUSTOM PROCEDURE TRAY) ×3 IMPLANT
PAD ARMBOARD 7.5X6 YLW CONV (MISCELLANEOUS) ×6 IMPLANT
PAD ELECT DEFIB RADIOL ZOLL (MISCELLANEOUS) ×3 IMPLANT
PENCIL BUTTON HOLSTER BLD 10FT (ELECTRODE) ×6 IMPLANT
PUNCH AORTIC ROTATE 4.0MM (MISCELLANEOUS) ×3 IMPLANT
PUNCH AORTIC ROTATE 4.5MM 8IN (MISCELLANEOUS) IMPLANT
PUNCH AORTIC ROTATE 5MM 8IN (MISCELLANEOUS) IMPLANT
SET CARDIOPLEGIA MPS 5001102 (MISCELLANEOUS) ×3 IMPLANT
SPONGE LAP 4X18 X RAY DECT (DISPOSABLE) ×3 IMPLANT
SUT BONE WAX W31G (SUTURE) ×3 IMPLANT
SUT ETHIBOND NAB MH 2-0 36IN (SUTURE) ×3 IMPLANT
SUT MNCRL AB 4-0 PS2 18 (SUTURE) IMPLANT
SUT PROLENE 3 0 SH DA (SUTURE) ×3 IMPLANT
SUT PROLENE 4 0 RB 1 (SUTURE)
SUT PROLENE 4 0 SH DA (SUTURE) IMPLANT
SUT PROLENE 4-0 RB1 .5 CRCL 36 (SUTURE) IMPLANT
SUT PROLENE 5 0 C 1 36 (SUTURE) ×3 IMPLANT
SUT PROLENE 6 0 C 1 30 (SUTURE) ×6 IMPLANT
SUT PROLENE 7 0 BV1 MDA (SUTURE) ×3 IMPLANT
SUT PROLENE 8 0 BV175 6 (SUTURE) ×6 IMPLANT
SUT SILK  1 MH (SUTURE) ×1
SUT SILK 1 MH (SUTURE) ×2 IMPLANT
SUT STEEL 6MS V (SUTURE) ×3 IMPLANT
SUT STEEL STERNAL CCS#1 18IN (SUTURE) IMPLANT
SUT STEEL SZ 6 DBL 3X14 BALL (SUTURE) ×3 IMPLANT
SUT VIC AB 1 CTX 36 (SUTURE) ×2
SUT VIC AB 1 CTX36XBRD ANBCTR (SUTURE) ×4 IMPLANT
SUT VIC AB 2-0 CT1 27 (SUTURE)
SUT VIC AB 2-0 CT1 TAPERPNT 27 (SUTURE) IMPLANT
SUT VIC AB 2-0 CTX 27 (SUTURE) IMPLANT
SUT VIC AB 3-0 SH 27 (SUTURE)
SUT VIC AB 3-0 SH 27X BRD (SUTURE) IMPLANT
SUT VIC AB 3-0 X1 27 (SUTURE) IMPLANT
SUT VICRYL 4-0 PS2 18IN ABS (SUTURE) IMPLANT
SUTURE E-PAK OPEN HEART (SUTURE) ×3 IMPLANT
SYSTEM SAHARA CHEST DRAIN ATS (WOUND CARE) ×3 IMPLANT
TAPE CLOTH SURG 4X10 WHT LF (GAUZE/BANDAGES/DRESSINGS) ×3 IMPLANT
TAPE PAPER 2X10 WHT MICROPORE (GAUZE/BANDAGES/DRESSINGS) ×3 IMPLANT
TOWEL OR 17X24 6PK STRL BLUE (TOWEL DISPOSABLE) ×6 IMPLANT
TOWEL OR 17X26 10 PK STRL BLUE (TOWEL DISPOSABLE) ×6 IMPLANT
TRAY FOLEY IC TEMP SENS 16FR (CATHETERS) ×3 IMPLANT
TUBE FEEDING 8FR 16IN STR KANG (MISCELLANEOUS) IMPLANT
TUBING INSUFFLATION (TUBING) ×3 IMPLANT
UNDERPAD 30X30 INCONTINENT (UNDERPADS AND DIAPERS) ×3 IMPLANT
WATER STERILE IRR 1000ML POUR (IV SOLUTION) ×6 IMPLANT

## 2014-09-02 NOTE — Brief Op Note (Signed)
08/31/2014 - 09/02/2014  11:51 AM  PATIENT:  Michael Duran  48 y.o. male  PRE-OPERATIVE DIAGNOSIS:  Coronary Artery Disease  POST-OPERATIVE DIAGNOSIS:  Coronary Artery Disease  PROCEDURE:   CORONARY ARTERY BYPASS GRAFTING x 2 (LIMA-LAD, free RIMA-PDA)  SURGEON:  Loreli SlotSteven C Hendrickson, MD  ASSISTANT: Coral CeoGina Lazlo Tunney, PA-C  ANESTHESIA:   general  PATIENT CONDITION:  ICU - intubated and hemodynamically stable.  PRE-OPERATIVE WEIGHT: 98.7 kg

## 2014-09-02 NOTE — OR Nursing (Signed)
2nd call to SICU 1234.

## 2014-09-02 NOTE — Progress Notes (Signed)
09/02/2014- Respiratory care note- Pt suctioned and extubated at 1622 to 4lpm cannula.  Pulmonary mechanics done prior to extubation. FVC 820, vt 400, rr22, nif -38.  Pt tolerated exutbation well with pt vocalizing post extubation.  Pt placed on 4lpm humidified cannula post extubation.  Will wean as tolerated.  RN at bedside for procedure.  s Bonnie Overdorf rrt, rcp

## 2014-09-02 NOTE — H&P (View-Only) (Signed)
Reason for Consult: severe 2 vessel CAD Referring Physician: Dr. Henry Smith, [name]  Michael Duran is an 48 y.o. male.  HPI: 48 yo man presents with a cc/o chest pain  Mr. Napoles is a 48 yo man with no prior history of coronary disease. He has a strong family history of coronary disease, hypertension and hyperlipidemia. He was diagnosed at one time with diabetes, but never required medication. He lost 70 pounds with diet and weight loss, and his A1C has been normal since then. He has been having exertional CP. This does not occur with normal activities, but if walks quickly for exercise he will get substernal chest pressure that is relieved by rest. He says his heart rate has to be about 120 before this will happen. He does not describe associated symptoms such as nausea, vomiting or diaphoresis. He has not had pain at rest. He does not have shortness of breath and he denies any PND or orthopnea.   He had a stress test which showed anterior and inferior ischemia.  Today he had cardiac catheterization which showed severe 2 vessel CAD of the proximal LAD and distal RCA. Lv function was preserved.   Past Medical History  Diagnosis Date  . Diabetes     not since losing weight  . Hyperlipemia     mild  . HTN (hypertension)   . Gout   . Sleep apnea     not an issue with weight loss    Past Surgical History  Procedure Laterality Date  . Lumbar laminectomy      Family History  Problem Relation Age of Onset  . CAD Father 60    AVR  . CAD Mother 69    CABG  . Diabetes Brother   . Heart attack Father   . Heart failure Mother   . Stroke Neg Hx     Social History:  reports that he has never smoked. He does not have any smokeless tobacco history on file. He reports that he drinks alcohol. He reports that he does not use illicit drugs.  Allergies: No Known Allergies  Medications:  Prior to Admission:  Prescriptions prior to admission  Medication Sig Dispense Refill  .  losartan (COZAAR) 50 MG tablet Take 100 mg by mouth daily.      . aspirin 81 MG tablet Take 81 mg by mouth daily.      . atorvastatin (LIPITOR) 80 MG tablet Take 1 tablet (80 mg total) by mouth daily.  30 tablet  11  . COLCRYS 0.6 MG tablet Take 0.6 mg by mouth as needed. PRN FOR GOUT FLARE UP      . metoprolol succinate (TOPROL-XL) 25 MG 24 hr tablet Take 1 tablet (25 mg total) by mouth daily.  30 tablet  11    Results for orders placed during the hospital encounter of 08/31/14 (from the past 48 hour(s))  BASIC METABOLIC PANEL     Status: Abnormal   Collection Time    08/31/14  1:30 PM      Result Value Ref Range   Sodium 139  137 - 147 mEq/L   Potassium 4.4  3.7 - 5.3 mEq/L   Chloride 102  96 - 112 mEq/L   CO2 26  19 - 32 mEq/L   Glucose, Bld 102 (*) 70 - 99 mg/dL   BUN 13  6 - 23 mg/dL   Creatinine, Ser 0.83  0.50 - 1.35 mg/dL   Calcium 9.6  8.4 -   10.5 mg/dL   GFR calc non Af Amer >90  >90 mL/min   GFR calc Af Amer >90  >90 mL/min   Comment: (NOTE)     The eGFR has been calculated using the CKD EPI equation.     This calculation has not been validated in all clinical situations.     eGFR's persistently <90 mL/min signify possible Chronic Kidney     Disease.   Anion gap 11  5 - 15  PROTIME-INR     Status: None   Collection Time    08/31/14  1:30 PM      Result Value Ref Range   Prothrombin Time 13.8  11.6 - 15.2 seconds   INR 1.06  0.00 - 1.49  CBC     Status: None   Collection Time    08/31/14  1:30 PM      Result Value Ref Range   WBC 6.2  4.0 - 10.5 K/uL   RBC 5.03  4.22 - 5.81 MIL/uL   Hemoglobin 14.5  13.0 - 17.0 g/dL   HCT 40.5  39.0 - 52.0 %   MCV 80.5  78.0 - 100.0 fL   MCH 28.8  26.0 - 34.0 pg   MCHC 35.8  30.0 - 36.0 g/dL   RDW 12.8  11.5 - 15.5 %   Platelets 217  150 - 400 K/uL  GLUCOSE, CAPILLARY     Status: None   Collection Time    08/31/14  5:11 PM      Result Value Ref Range   Glucose-Capillary 98  70 - 99 mg/dL    No results found.  Review of  Systems  Constitutional: Positive for malaise/fatigue. Negative for fever.  Respiratory: Negative for wheezing.   Cardiovascular: Positive for chest pain. Negative for orthopnea, leg swelling and PND.  Musculoskeletal: Positive for back pain.  Endo/Heme/Allergies: Does not bruise/bleed easily.  All other systems reviewed and are negative.  Blood pressure 180/106, pulse 68, temperature 97.9 F (36.6 C), temperature source Oral, resp. rate 10, height 5' 10" (1.778 m), weight 209 lb (94.802 kg), SpO2 98.00%. Physical Exam  Constitutional: He is oriented to person, place, and time. He appears well-developed and well-nourished. No distress.  HENT:  Head: Normocephalic and atraumatic.  Eyes: EOM are normal. Pupils are equal, round, and reactive to light.  Neck: Neck supple. No thyromegaly present.  No carotid bruits  Cardiovascular: Normal rate, regular rhythm and intact distal pulses.   Murmur (2/6 systolic) heard. Respiratory: Effort normal and breath sounds normal. He has no wheezes. He has no rales.  GI: Soft. There is no tenderness.  Musculoskeletal: He exhibits no edema.  Lymphadenopathy:    He has no cervical adenopathy.  Neurological: He is alert and oriented to person, place, and time. No cranial nerve deficit.  No motor deficit  Skin: Skin is warm and dry.  Psychiatric: He has a normal mood and affect.   CARDIAC CATHETERIZATION LEFT VENTRICULOGRAM: Left ventricular angiogram was done in the 30 RAO projection and revealed normal contractility without regional wall motion abnormality. EF 65% the  IMPRESSIONS: 1. Severe two-vessel coronary disease involving proximal to mid LAD and distal RCA before the origin of PDA and left ventricular branches. Circumflex has mild to moderate proximal disease.  2. Normal left ventricular function.  3. High-risk myocardial perfusion study, with a large region of inferior ischemia, apical ischemia, and transient ischemic dilatation.   RECOMMENDATION: The patient could likely be treated with PCI to the RCA. The   LAD was likewise be technically challenging but possible to treat with PCI. When I consider his young age and the myocardium at risk, bypass grafting with arterial conduits would probably be his best long-term option.   Assessment/Plan: 48 yo man with multiple CRF who presents with exertional angina. He has severe 2 vessel CAD, including complex proximal LAD disease. CABG is indicated for survival benefit and relief of symptoms.  I discussed the general nature of the procedure, the need for general anesthesia, and the incisions to be used with Mr and Mrs Ruffner. We discussed the expected hospital stay, overall recovery and short and long term outcomes. We reviewed the indications, risks, benefits and alternatives. They understand the risks include, but are not limited to death, stroke, MI, DVT/PE, bleeding, possible need for transfusion, infections, cardiac arrhythmias, other organ system dysfunction including respiratory, renal, or GI complications. He accepts the risks and agrees to proceed.  Plan CABG x 2 with bilateral mammary arteries on Friday 10/9  Alcee Sipos C 08/31/2014, 6:28 PM      

## 2014-09-02 NOTE — Transfer of Care (Signed)
Immediate Anesthesia Transfer of Care Note  Patient: Michael Duran  Procedure(s) Performed: Procedure(s) with comments: CORONARY ARTERY BYPASS GRAFTING (CABG), ON PUMP, TIMES TWO, USING BILATERAL INTERNAL MAMMARY ARTERIES. (N/A) - CABG w/ bilateral mammaries TRANSESOPHAGEAL ECHOCARDIOGRAM (TEE) (N/A)  Patient Location: SICU  Anesthesia Type:General  Level of Consciousness: Patient remains intubated per anesthesia plan  Airway & Oxygen Therapy: Patient remains intubated per anesthesia plan and Patient placed on Ventilator (see vital sign flow sheet for setting)  Post-op Assessment: Report given to PACU RN  Post vital signs: Reviewed and stable  Complications: No apparent anesthesia complications

## 2014-09-02 NOTE — Interval H&P Note (Signed)
History and Physical Interval Note:  09/02/2014 7:27 AM  Michael Duran  has presented today for surgery, with the diagnosis of CAD  The various methods of treatment have been discussed with the patient and family. After consideration of risks, benefits and other options for treatment, the patient has consented to  Procedure(s) with comments: CORONARY ARTERY BYPASS GRAFTING (CABG) (N/A) - CABG w/ bilateral mammaries TRANSESOPHAGEAL ECHOCARDIOGRAM (TEE) (N/A) as a surgical intervention .  The patient's history has been reviewed, patient examined, no change in status, stable for surgery.  I have reviewed the patient's chart and labs.  Questions were answered to the patient's satisfaction.     HENDRICKSON,STEVEN C

## 2014-09-02 NOTE — Anesthesia Preprocedure Evaluation (Addendum)
Anesthesia Evaluation  Patient identified by MRN, date of birth, ID band Patient awake    Reviewed: Allergy & Precautions, H&P , NPO status , Patient's Chart, lab work & pertinent test results, reviewed documented beta blocker date and time   Airway Mallampati: I TM Distance: >3 FB Neck ROM: Full    Dental  (+) Teeth Intact, Dental Advisory Given   Pulmonary  breath sounds clear to auscultation        Cardiovascular hypertension, Pt. on medications Rhythm:Regular Rate:Normal   Exertional CP with abnormal stress test             - Nuclear (10/15): Inferior and apical defect consistent with ischemia, cannot rule out TID, ECG changes and chest pain during Bruce protocol; high risk             - Cath 08/31/2014 severe 2 v dx, 80% prox to mid LAD, 75-80% distal LAD, 99% RCA before large PDA             - evaluated by Dr. Dorris FetchHendrickson of CT surgery for bypass given young age. Plan for CABG x 2 with bilateral mammary arteries on Friday 10/9 (likely need hold lovenox prior to surgery tomorrow)             - continue ASA, BB, lipitor    Neuro/Psych    GI/Hepatic   Endo/Other  diabetes  Renal/GU      Musculoskeletal   Abdominal   Peds  Hematology   Anesthesia Other Findings   Reproductive/Obstetrics                         Anesthesia Physical Anesthesia Plan  ASA: III  Anesthesia Plan: General   Post-op Pain Management:    Induction: Intravenous  Airway Management Planned: Oral ETT  Additional Equipment: Arterial line, CVP, PA Cath, 3D TEE and Ultrasound Guidance Line Placement  Intra-op Plan:   Post-operative Plan: Post-operative intubation/ventilation  Informed Consent: I have reviewed the patients History and Physical, chart, labs and discussed the procedure including the risks, benefits and alternatives for the proposed anesthesia with the patient or authorized representative who has indicated  his/her understanding and acceptance.     Plan Discussed with: CRNA and Surgeon  Anesthesia Plan Comments:         Anesthesia Quick Evaluation

## 2014-09-02 NOTE — OR Nursing (Signed)
1st call to SICU 1206.

## 2014-09-02 NOTE — Anesthesia Postprocedure Evaluation (Signed)
Anesthesia Post Note  Patient: Michael Duran  Procedure(s) Performed: Procedure(s) (LRB): CORONARY ARTERY BYPASS GRAFTING (CABG), ON PUMP, TIMES TWO, USING BILATERAL INTERNAL MAMMARY ARTERIES. (N/A) TRANSESOPHAGEAL ECHOCARDIOGRAM (TEE) (N/A)  Anesthesia type: General  Patient location: ICU  Post pain: Pain level controlled  Post assessment: Post-op Vital signs reviewed  Last Vitals:  Filed Vitals:   09/02/14 0446  BP: 119/81  Pulse: 61  Temp: 36.7 C  Resp: 18    Post vital signs: stable  Level of consciousness: Patient remains intubated per anesthesia plan  Complications: No apparent anesthesia complications

## 2014-09-02 NOTE — Anesthesia Postprocedure Evaluation (Signed)
  Anesthesia Post-op Note  Patient: Michael Duran  Procedure(s) Performed: Procedure(s) with comments: CORONARY ARTERY BYPASS GRAFTING (CABG), ON PUMP, TIMES TWO, USING BILATERAL INTERNAL MAMMARY ARTERIES. (N/A) - CABG w/ bilateral mammaries TRANSESOPHAGEAL ECHOCARDIOGRAM (TEE) (N/A)  Patient Location: SICU  Anesthesia Type:General  Level of Consciousness: Patient remains intubated per anesthesia plan  Airway and Oxygen Therapy: Patient remains intubated per anesthesia plan and Patient placed on Ventilator (see vital sign flow sheet for setting)  Post-op Pain: none  Post-op Assessment: Post-op Vital signs reviewed, Patient's Cardiovascular Status Stable and Respiratory Function Stable  Post-op Vital Signs: Reviewed and stable  Last Vitals:  Filed Vitals:   09/02/14 0446  BP: 119/81  Pulse: 61  Temp: 36.7 C  Resp: 18    Complications: No apparent anesthesia complications

## 2014-09-02 NOTE — Anesthesia Procedure Notes (Addendum)
Procedure Name: Intubation Date/Time: 09/02/2014 7:51 AM Performed by: De NurseENNIE, Avory Mimbs E Pre-anesthesia Checklist: Patient identified, Emergency Drugs available, Suction available, Patient being monitored and Timeout performed Patient Re-evaluated:Patient Re-evaluated prior to inductionOxygen Delivery Method: Circle system utilized Preoxygenation: Pre-oxygenation with 100% oxygen Intubation Type: IV induction Ventilation: Mask ventilation without difficulty Laryngoscope Size: Mac and 4 Grade View: Grade III Tube type: Oral Tube size: 8.0 mm Number of attempts: 1 Airway Equipment and Method: Stylet Placement Confirmation: ETT inserted through vocal cords under direct vision,  positive ETCO2 and breath sounds checked- equal and bilateral Secured at: 23 cm Tube secured with: Tape Dental Injury: Teeth and Oropharynx as per pre-operative assessment

## 2014-09-02 NOTE — Progress Notes (Signed)
S/p CABG x 2 BIMA  Extubated, some incisional pain  BP 104/71  Pulse 66  Temp(Src) 100.8 F (38.2 C) (Core (Comment))  Resp 21  Ht 5\' 10"  (1.778 m)  Wt 215 lb 9.8 oz (97.8 kg)  BMI 30.94 kg/m2  SpO2 100%  28/12 CI= 2.3   Intake/Output Summary (Last 24 hours) at 09/02/14 1759 Last data filed at 09/02/14 1700  Gross per 24 hour  Intake 4059.71 ml  Output   3835 ml  Net 224.71 ml    Doing well s/p CABG  Will add toradol for pain

## 2014-09-03 ENCOUNTER — Inpatient Hospital Stay (HOSPITAL_COMMUNITY): Payer: Managed Care, Other (non HMO)

## 2014-09-03 DIAGNOSIS — Z01812 Encounter for preprocedural laboratory examination: Secondary | ICD-10-CM

## 2014-09-03 LAB — CBC
HEMATOCRIT: 29.9 % — AB (ref 39.0–52.0)
HEMATOCRIT: 28.9 % — AB (ref 39.0–52.0)
Hemoglobin: 10.6 g/dL — ABNORMAL LOW (ref 13.0–17.0)
Hemoglobin: 9.9 g/dL — ABNORMAL LOW (ref 13.0–17.0)
MCH: 29 pg (ref 26.0–34.0)
MCH: 28.4 pg (ref 26.0–34.0)
MCHC: 35.5 g/dL (ref 30.0–36.0)
MCHC: 34.3 g/dL (ref 30.0–36.0)
MCV: 81.7 fL (ref 78.0–100.0)
MCV: 83 fL (ref 78.0–100.0)
PLATELETS: 154 10*3/uL (ref 150–400)
PLATELETS: 133 10*3/uL — AB (ref 150–400)
RBC: 3.66 MIL/uL — ABNORMAL LOW (ref 4.22–5.81)
RBC: 3.48 MIL/uL — ABNORMAL LOW (ref 4.22–5.81)
RDW: 13.1 % (ref 11.5–15.5)
RDW: 13.3 % (ref 11.5–15.5)
WBC: 13.1 10*3/uL — AB (ref 4.0–10.5)
WBC: 10.8 10*3/uL — AB (ref 4.0–10.5)

## 2014-09-03 LAB — POCT I-STAT, CHEM 8
BUN: 19 mg/dL (ref 6–23)
Calcium, Ion: 1.23 mmol/L (ref 1.12–1.23)
Chloride: 100 mEq/L (ref 96–112)
Creatinine, Ser: 1.1 mg/dL (ref 0.50–1.35)
Glucose, Bld: 138 mg/dL — ABNORMAL HIGH (ref 70–99)
HCT: 28 % — ABNORMAL LOW (ref 39.0–52.0)
HEMOGLOBIN: 9.5 g/dL — AB (ref 13.0–17.0)
Potassium: 4.1 mEq/L (ref 3.7–5.3)
SODIUM: 137 meq/L (ref 137–147)
TCO2: 25 mmol/L (ref 0–100)

## 2014-09-03 LAB — CREATININE, SERUM
Creatinine, Ser: 1.1 mg/dL (ref 0.50–1.35)
GFR calc non Af Amer: 77 mL/min — ABNORMAL LOW (ref >90)
GFR, EST AFRICAN AMERICAN: 89 mL/min — AB (ref >90)

## 2014-09-03 LAB — BASIC METABOLIC PANEL
ANION GAP: 13 (ref 5–15)
BUN: 14 mg/dL (ref 6–23)
CALCIUM: 8.4 mg/dL (ref 8.4–10.5)
CHLORIDE: 104 meq/L (ref 96–112)
CO2: 23 meq/L (ref 19–32)
CREATININE: 0.77 mg/dL (ref 0.50–1.35)
GFR calc Af Amer: >90 mL/min (ref >90)
GFR calc non Af Amer: >90 mL/min (ref >90)
Glucose, Bld: 108 mg/dL — ABNORMAL HIGH (ref 70–99)
Potassium: 4.1 mEq/L (ref 3.7–5.3)
Sodium: 140 mEq/L (ref 137–147)

## 2014-09-03 LAB — GLUCOSE, CAPILLARY
GLUCOSE-CAPILLARY: 88 mg/dL (ref 70–99)
GLUCOSE-CAPILLARY: 131 mg/dL — AB (ref 70–99)
Glucose-Capillary: 119 mg/dL — ABNORMAL HIGH (ref 70–99)
Glucose-Capillary: 149 mg/dL — ABNORMAL HIGH (ref 70–99)
Glucose-Capillary: 134 mg/dL — ABNORMAL HIGH (ref 70–99)

## 2014-09-03 LAB — MAGNESIUM
Magnesium: 2.1 mg/dL (ref 1.5–2.5)
Magnesium: 2.2 mg/dL (ref 1.5–2.5)

## 2014-09-03 MED ORDER — CETYLPYRIDINIUM CHLORIDE 0.05 % MT LIQD
7.0000 mL | Freq: Two times a day (BID) | OROMUCOSAL | Status: DC
  Administered 2014-09-03 (×2): 7 mL via OROMUCOSAL

## 2014-09-03 MED ORDER — DIPHENHYDRAMINE HCL 12.5 MG/5ML PO ELIX
25.0000 mg | ORAL_SOLUTION | Freq: Four times a day (QID) | ORAL | Status: DC | PRN
  Administered 2014-09-03: 25 mg via ORAL
  Filled 2014-09-03: qty 10

## 2014-09-03 MED ORDER — INSULIN ASPART 100 UNIT/ML ~~LOC~~ SOLN: 0.0000 [IU] | SUBCUTANEOUS | Status: DC

## 2014-09-03 MED ORDER — DIPHENHYDRAMINE HCL 50 MG/ML IJ SOLN: 25.0000 mg | Freq: Four times a day (QID) | INTRAMUSCULAR | Status: DC | PRN

## 2014-09-03 MED ORDER — ENOXAPARIN SODIUM 40 MG/0.4ML ~~LOC~~ SOLN
40.0000 mg | Freq: Every day | SUBCUTANEOUS | Status: DC
  Administered 2014-09-03 – 2014-09-06 (×4): 40 mg via SUBCUTANEOUS
  Filled 2014-09-03 (×6): qty 0.4

## 2014-09-03 MED ORDER — INSULIN ASPART 100 UNIT/ML ~~LOC~~ SOLN
0.0000 [IU] | SUBCUTANEOUS | Status: DC
  Administered 2014-09-03 – 2014-09-04 (×4): 2 [IU] via SUBCUTANEOUS

## 2014-09-03 MED ORDER — SODIUM CHLORIDE 0.9 % IV BOLUS (SEPSIS)
500.0000 mL | Freq: Once | INTRAVENOUS | Status: AC
  Administered 2014-09-03: 500 mL via INTRAVENOUS

## 2014-09-03 MED ORDER — CHLORHEXIDINE GLUCONATE 0.12 % MT SOLN
15.0000 mL | Freq: Two times a day (BID) | OROMUCOSAL | Status: DC
  Filled 2014-09-03 (×2): qty 15

## 2014-09-03 NOTE — Op Note (Signed)
NAMDemetrios Isaacs:  Michael Duran, Michael Duran               ACCOUNT NO.:  [id number]  MEDICAL RECORD NO.:  [id number]  LOCATION:  2S01C                        FACILITY:  [facility]  PHYSICIAN:  Salvatore DecentSteven C. Dorris FetchHendrickson, M.D.DATE OF BIRTH:  November 17, 1966  DATE OF PROCEDURE:  09/02/2014 DATE OF DISCHARGE:                              OPERATIVE REPORT   PREOPERATIVE DIAGNOSIS:  Severe two-vessel coronary disease with class III angina.  POSTOPERATIVE DIAGNOSIS:  Severe two-vessel coronary disease with class III angina.  PROCEDURE:  Median sternotomy, extracorporeal circulation, coronary artery bypass grafting x2 (left internal mammary artery to left anterior descending, free right internal mammary artery to posterior descending).  SURGEON:  Salvatore DecentSteven C. Dorris FetchHendrickson, M.D.  ASSISTANT:  Coral CeoGina Collins, P.A.  ANESTHESIA:  General.  FINDINGS:  Good quality targets, good quality conduits.  Right mammary would not reach the posterior descending as a pedicle graft, therefore used as a free graft.  Transesophageal echocardiography revealed preserved left ventricular wall motion with no valvular pathology.  CLINICAL NOTE:  Michael Duran is a 48 year old gentleman with multiple cardiac risk factors who presented with exertional angina.  At catheterization, he had severe two-vessel coronary disease with a complicated proximal LAD stenosis.  The patient was referred for coronary artery bypass grafting.  The indications, risks, benefits, and alternatives were discussed in detail with the patient.  He understood and accepted the risks and agreed to proceed.  OPERATIVE NOTE:  Michael Duran was brought to the preoperative holding area on September 02, 2014.  There, Anesthesia placed a Swan-Ganz catheter and an arterial blood pressure monitoring line.  He was taken to the operating room, anesthetized, and intubated.  Foley catheter was placed. The chest, abdomen, and legs were prepped and draped in the usual sterile fashion.  Transesophageal  echocardiography was performed.  It revealed preserved left ventricular wall motion with no significant valvular pathology.  A median sternotomy was performed.  The left internal mammary artery was harvested using standard technique.  5000 units of heparin was administered prior to dividing the distal end of the mammary artery. There was excellent flow through the cut end of the vessel.  After harvesting the left mammary artery, the right internal mammary artery then was harvested again using standard technique.  This vessel likewise had excellent flow when divided distally.  Sternal retractor was placed.  The pericardium was opened.  The remainder of the full heparin dose was given.  The distal right coronary was inspected and was heavily calcified all the way out to the origin of the posterior descending.  The right mammary would not reach this as a pedicle graft, therefore proximal end was divided and the proximal stump suture ligated.  The mammary was placed in a papaverine solution to be used as a free graft.  After confirming adequate anticoagulation with ACT measurement, the aorta was cannulated via concentric 2-0 Ethilon pledgeted pursestring sutures.  A dual-stage venous cannula was placed via a pursestring suture in the right atrial appendage.  Cardiopulmonary bypass was instituted.  Flows were maintained per protocol.  The coronary arteries were inspected and anastomotic sites were chosen.  The conduits were inspected and prepared.  A foam pad was placed in the pericardium to insulate  the heart.  A temperature probe was placed in myocardial septum and a cardioplegia cannula was placed in the ascending aorta.  The aorta was crossclamped.  The left ventricle was emptied via the aortic root vent.  Cardiac arrest then was achieved with a combination of cold antegrade blood cardioplegia and topical iced saline.  1 L of cardioplegia was administered.  There was a rapid diastolic  arrest. There was septal cooling to 10 degrees Celsius.  The distal end of the free right mammary graft was beveled.  It was then anastomosed end-to-side to the posterior descending branch of the right coronary near its origin.  A probe did pass retrograde into the right coronary and also passed easily distally.  The right mammary was a good quality conduit and the posterior descending was a good quality target. An end-to-side anastomosis was performed with a running 8-0 Prolene suture.  At the completion of anastomosis, a probe passed easily proximally and distally prior to tying the suture.  After tying the suture, cardioplegia was administered via the aortic root, and there was good backbleeding from the mammary artery.  Next, the left internal mammary artery was brought through a window in the pericardium, the distal end was beveled.  It was then anastomosed end-to-side to the distal LAD.  The LAD was a 2 mm good quality target. There was some plaquing distally towards the apex.  A 1.5 mm probe did pass through this area.  The mammary was a 2 mm good quality conduit and an end-to-side anastomosis was performed with a running 8-0 Prolene suture. At the completion of the mammary to LAD anastomosis, the bulldog clamp was briefly removed to inspect for hemostasis.  Rapid septal rewarming was noted.  The bulldog clamp was tacked to the epicardial surface of the heart with 6-0 Prolene sutures.  The cardioplegia cannula was removed from the ascending aorta.  The proximal end of the right mammary graft was beveled and anastomosed end- to-side to a 4.0 mm punch aortotomy with a running 7-0 Prolene suture. At the completion of the proximal anastomosis, the patient was placed in Trendelenburg position.  Lidocaine was administered.  The bulldog clamp was again removed from the left mammary artery and the aortic root was de-aired and the aortic crossclamp was removed.  The total  crossclamp time was 46 minutes.  The patient required a single defibrillation with 10 joules.  He was in sinus rhythm thereafter.  The proximal and distal anastomoses were inspected for hemostasis. Epicardial pacing wires were placed on the right ventricle and right atrium.  When the patient had rewarmed to a core temperature of 37 degrees Celsius, he was weaned from cardiopulmonary bypass on the first attempt without inotropic support.  Total bypass time was 75 minutes. The initial cardiac index was greater than 2 L/minute/meter squared, and the patient remained hemodynamically stable throughout the postbypass period.  Transesophageal echocardiography revealed no change in left ventricular wall motion from the prebypass study.  A test dose of protamine was administered and was well tolerated.  The atrial and aortic cannulae were removed.  The remainder of the protamine was administered without incident.  The chest was irrigated with warm saline.  Hemostasis was achieved.  32-French Blake drains were placed into each pleural space and a 36-French chest tube was placed in the mediastinum.  The pericardium was reapproximated with interrupted 3-0 silk sutures.  It came together easily without tension.  The sternum was closed with a combination of single and double  heavy gauge stainless steel wires.  The pectoralis fascia, subcutaneous tissue, and skin were closed in standard fashion.  All sponge, needle, and instrument counts were correct at the end of the procedure.  There were no intraoperative complications.  The patient was transported from the operating room to the surgical intensive care unit in good condition.     Salvatore Decent Dorris Fetch, M.D.     SCH/MEDQ  D:  09/02/2014  T:  09/03/2014  Job:  [id number]

## 2014-09-03 NOTE — Progress Notes (Addendum)
1 Day Post-Op Procedure(s) (LRB): CORONARY ARTERY BYPASS GRAFTING (CABG), ON PUMP, TIMES TWO, USING BILATERAL INTERNAL MAMMARY ARTERIES. (N/A) TRANSESOPHAGEAL ECHOCARDIOGRAM (TEE) (N/A) Subjective: Some incisional pain- "really sore" Denies nausea  Objective: Vital signs in last 24 hours: Temp:  [98.2 F (36.8 C)-101.5 F (38.6 C)] 99.3 F (37.4 C) (10/10 0800) Pulse Rate:  [61-81] 76 (10/10 0800) Cardiac Rhythm:  [-] Normal sinus rhythm (10/10 0800) Resp:  [12-29] 25 (10/10 0800) BP: (93-117)/(57-76) 102/62 mmHg (10/10 0400) SpO2:  [95 %-100 %] 96 % (10/10 0800) Arterial Line BP: (96-126)/(48-75) 106/57 mmHg (10/10 0800) FiO2 (%):  [40 %-50 %] 50 % (10/09 1600) Weight:  [215 lb 13.3 oz (97.9 kg)] 215 lb 13.3 oz (97.9 kg) (10/10 0500)  Hemodynamic parameters for last 24 hours: PAP: (21-38)/(9-21) 32/18 mmHg CO:  [4.1 L/min-6.2 L/min] 4.1 L/min CI:  [1.9 L/min/m2-2.9 L/min/m2] 1.9 L/min/m2  Intake/Output from previous day: 10/09 0701 - 10/10 0700 In: 5174.5 [P.O.:180; I.V.:3214.5; Blood:580; NG/GT:30; IV Piggyback:1170] Out: 3640 [Urine:1770; Emesis/NG output:150; Blood:1050; Chest Tube:670] Intake/Output this shift: Total I/O In: 43.2 [I.V.:43.2] Out: 185 [Urine:35; Chest Tube:150]  General appearance: alert and no distress Neurologic: intact Heart: regular rate and rhythm Lungs: diminished breath sounds bibasilar Abdomen: normal findings: soft, non-tender + pericardial rub  Lab Results:  Recent Labs  09/02/14 1910 09/02/14 1921 09/03/14 0410  WBC 11.3*  --  13.1*  HGB 11.3* 10.5* 10.6*  HCT 31.7* 31.0* 29.9*  PLT 150  --  154   BMET:  Recent Labs  08/31/14 1330  09/02/14 1921 09/03/14 0410  NA 139  < > 139 140  K 4.4  < > 4.1 4.1  CL 102  < > 104 104  CO2 26  --   --  23  GLUCOSE 102*  < > 111* 108*  BUN 13  < > 9 14  CREATININE 0.83  < > 0.70 0.77  CALCIUM 9.6  --   --  8.4  < > = values in this interval not displayed.  PT/INR:  Recent Labs  09/02/14 1300  LABPROT 16.9*  INR 1.37   ABG    Component Value Date/Time   PHART 7.301* 09/02/2014 1722   HCO3 26.7* 09/02/2014 1722   TCO2 23 09/02/2014 1921   ACIDBASEDEF 1.0 09/02/2014 1312   O2SAT 98.0 09/02/2014 1722   CBG (last 3)   Recent Labs  09/02/14 2301 09/02/14 2355 09/03/14 0406  GLUCAP 100* 98 88    Assessment/Plan: S/P Procedure(s) (LRB): CORONARY ARTERY BYPASS GRAFTING (CABG), ON PUMP, TIMES TWO, USING BILATERAL INTERNAL MAMMARY ARTERIES. (N/A) TRANSESOPHAGEAL ECHOCARDIOGRAM (TEE) (N/A) POD # 1 CABG CV- good index, BP a little low- still on some neo- wean as BP tolerates  ASA, beta blocker, statin, no ACE-I for now  ECG shows pericarditis and has rub on exam- continue toradol an additional 24 hours RESP- IS for bibasilar atelectasis  RENAL- positive I/O- diurese as BP allows  Anemia secondary to ABL- mild, follow  ENDO- CBG well controlled  Dc CT  OOB, ambulate   LOS: 3 days    Ty Oshima C 09/03/2014

## 2014-09-03 NOTE — Progress Notes (Signed)
Dr. Dorris FetchHendrickson made aware of low UOP. Order received for 500 mL normal saline.  Will give saline and continue to monitor.

## 2014-09-03 NOTE — Progress Notes (Signed)
Feels well  BP 97/49  Pulse 109  Temp(Src) 98.6 F (37 C) (Oral)  Resp 22  Ht 5\' 10"  (1.778 m)  Wt 215 lb 13.3 oz (97.9 kg)  BMI 30.97 kg/m2  SpO2 100%   Intake/Output Summary (Last 24 hours) at 09/03/14 1810 Last data filed at 09/03/14 1700  Gross per 24 hour  Intake 1961.05 ml  Output   1180 ml  Net 781.05 ml    UO a little low, creatinine 1.1- gave 500 ml of saline earlier- will stop toradol

## 2014-09-04 ENCOUNTER — Inpatient Hospital Stay (HOSPITAL_COMMUNITY): Payer: Managed Care, Other (non HMO)

## 2014-09-04 LAB — BASIC METABOLIC PANEL
ANION GAP: 9 (ref 5–15)
BUN: 21 mg/dL (ref 6–23)
CALCIUM: 8.3 mg/dL — AB (ref 8.4–10.5)
CHLORIDE: 99 meq/L (ref 96–112)
CO2: 27 mEq/L (ref 19–32)
CREATININE: 1.18 mg/dL (ref 0.50–1.35)
GFR calc non Af Amer: 71 mL/min — ABNORMAL LOW (ref >90)
GFR, EST AFRICAN AMERICAN: 82 mL/min — AB (ref >90)
Glucose, Bld: 132 mg/dL — ABNORMAL HIGH (ref 70–99)
Potassium: 4.1 mEq/L (ref 3.7–5.3)
SODIUM: 135 meq/L — AB (ref 137–147)

## 2014-09-04 LAB — GLUCOSE, CAPILLARY
GLUCOSE-CAPILLARY: 113 mg/dL — AB (ref 70–99)
Glucose-Capillary: 113 mg/dL — ABNORMAL HIGH (ref 70–99)
Glucose-Capillary: 134 mg/dL — ABNORMAL HIGH (ref 70–99)

## 2014-09-04 LAB — CBC
HCT: 28 % — ABNORMAL LOW (ref 39.0–52.0)
HEMOGLOBIN: 9.6 g/dL — AB (ref 13.0–17.0)
MCH: 29.4 pg (ref 26.0–34.0)
MCHC: 34.3 g/dL (ref 30.0–36.0)
MCV: 85.6 fL (ref 78.0–100.0)
Platelets: 131 10*3/uL — ABNORMAL LOW (ref 150–400)
RBC: 3.27 MIL/uL — ABNORMAL LOW (ref 4.22–5.81)
RDW: 13.3 % (ref 11.5–15.5)
WBC: 10.4 10*3/uL (ref 4.0–10.5)

## 2014-09-04 MED ORDER — LOSARTAN POTASSIUM 50 MG PO TABS
100.0000 mg | ORAL_TABLET | Freq: Every day | ORAL | Status: DC
  Administered 2014-09-05 – 2014-09-06 (×2): 100 mg via ORAL
  Filled 2014-09-04 (×4): qty 2

## 2014-09-04 MED ORDER — MOVING RIGHT ALONG BOOK
Freq: Once | Status: DC
Start: 2014-09-04 — End: 2014-09-07
  Filled 2014-09-04: qty 1

## 2014-09-04 MED ORDER — METOPROLOL SUCCINATE ER 25 MG PO TB24
25.0000 mg | ORAL_TABLET | Freq: Every day | ORAL | Status: DC
  Administered 2014-09-04 – 2014-09-05 (×2): 25 mg via ORAL
  Filled 2014-09-04 (×3): qty 1

## 2014-09-04 MED ORDER — SODIUM CHLORIDE 0.9 % IJ SOLN
3.0000 mL | Freq: Two times a day (BID) | INTRAMUSCULAR | Status: DC
  Administered 2014-09-04 – 2014-09-07 (×7): 3 mL via INTRAVENOUS

## 2014-09-04 MED ORDER — SODIUM CHLORIDE 0.9 % IJ SOLN: 3.0000 mL | INTRAMUSCULAR | Status: DC | PRN

## 2014-09-04 MED ORDER — ZOLPIDEM TARTRATE 5 MG PO TABS
5.0000 mg | ORAL_TABLET | Freq: Every evening | ORAL | Status: DC | PRN
  Administered 2014-09-06: 5 mg via ORAL
  Filled 2014-09-04: qty 1

## 2014-09-04 MED ORDER — SODIUM CHLORIDE 0.9 % IV SOLN
250.0000 mL | INTRAVENOUS | Status: DC | PRN
Start: 2014-09-04 — End: 2014-09-07

## 2014-09-04 MED ORDER — GUAIFENESIN-DM 100-10 MG/5ML PO SYRP: 15.0000 mL | ORAL_SOLUTION | ORAL | Status: DC | PRN

## 2014-09-04 MED ORDER — ALUM & MAG HYDROXIDE-SIMETH 200-200-20 MG/5ML PO SUSP
15.0000 mL | ORAL | Status: DC | PRN
  Administered 2014-09-04: 30 mL via ORAL
  Filled 2014-09-04: qty 30

## 2014-09-04 MED ORDER — MAGNESIUM HYDROXIDE 400 MG/5ML PO SUSP
30.0000 mL | Freq: Every day | ORAL | Status: DC | PRN
  Administered 2014-09-05: 30 mL via ORAL
  Filled 2014-09-04: qty 30

## 2014-09-04 NOTE — Progress Notes (Signed)
Patient tx to 2W with monitor on 4 L nasal cannula, patient ambulated the entire way, tolerated well

## 2014-09-04 NOTE — Progress Notes (Signed)
PAGED DR. HENDRICKSON FOR GAS RELIEF MEDICATION. AWAITING CALL BACK.

## 2014-09-04 NOTE — Progress Notes (Signed)
2 Days Post-Op Procedure(s) (LRB): CORONARY ARTERY BYPASS GRAFTING (CABG), ON PUMP, TIMES TWO, USING BILATERAL INTERNAL MAMMARY ARTERIES. (N/A) TRANSESOPHAGEAL ECHOCARDIOGRAM (TEE) (N/A) Subjective: Some incisional pain, denies nausea  Objective: Vital signs in last 24 hours: Temp:  [98.3 F (36.8 C)-101 F (38.3 C)] 98.8 F (37.1 C) (10/11 0400) Pulse Rate:  [71-111] 111 (10/11 0700) Cardiac Rhythm:  [-] Normal sinus rhythm (10/10 2000) Resp:  [17-32] 18 (10/11 0700) BP: (97-130)/(49-74) 130/62 mmHg (10/11 0700) SpO2:  [91 %-100 %] 96 % (10/11 0700) Arterial Line BP: (89-125)/(44-59) 114/53 mmHg (10/10 1115) Weight:  [216 lb 8 oz (98.204 kg)] 216 lb 8 oz (98.204 kg) (10/11 0700)  Hemodynamic parameters for last 24 hours:    Intake/Output from previous day: 10/10 0701 - 10/11 0700 In: 1964.5 [P.O.:1350; I.V.:514.5; IV Piggyback:100] Out: 940 [Urine:680; Chest Tube:260] Intake/Output this shift:    General appearance: alert and no distress Neurologic: intact Heart: regular rate and rhythm Lungs: diminished breath sounds bibasilar L>R Abdomen: normal findings: soft, non-tender  Lab Results:  Recent Labs  09/03/14 1645 09/03/14 1646 09/04/14 0430  WBC 10.8*  --  10.4  HGB 9.9* 9.5* 9.6*  HCT 28.9* 28.0* 28.0*  PLT 133*  --  131*   BMET:  Recent Labs  09/03/14 0410  09/03/14 1646 09/04/14 0430  NA 140  --  137 135*  K 4.1  --  4.1 4.1  CL 104  --  100 99  CO2 23  --   --  27  GLUCOSE 108*  --  138* 132*  BUN 14  --  19 21  CREATININE 0.77  < > 1.10 1.18  CALCIUM 8.4  --   --  8.3*  < > = values in this interval not displayed.  PT/INR:  Recent Labs  09/02/14 1300  LABPROT 16.9*  INR 1.37   ABG    Component Value Date/Time   PHART 7.301* 09/02/2014 1722   HCO3 26.7* 09/02/2014 1722   TCO2 25 09/03/2014 1646   ACIDBASEDEF 1.0 09/02/2014 1312   O2SAT 98.0 09/02/2014 1722   CBG (last 3)   Recent Labs  09/03/14 1948 09/04/14 0002 09/04/14 0358   GLUCAP 134* 113* 134*    Assessment/Plan: S/P Procedure(s) (LRB): CORONARY ARTERY BYPASS GRAFTING (CABG), ON PUMP, TIMES TWO, USING BILATERAL INTERNAL MAMMARY ARTERIES. (N/A) TRANSESOPHAGEAL ECHOCARDIOGRAM (TEE) (N/A) Plan for transfer to step-down: see transfer orders  CV- BP better, off neo now   RESP- bibasilar atelectasis, L>R- IS  RENAL- lytes, creatinine Ok, UO has picked up  Dc foley  ENDO- CBG OK- discontinue SSI  Anemia secondary to ABL- mild, follow   LOS: 4 days    Kerianna Rawlinson C 09/04/2014

## 2014-09-05 ENCOUNTER — Encounter (HOSPITAL_COMMUNITY): Payer: Self-pay | Admitting: Thoracic Surgery (Cardiothoracic Vascular Surgery)

## 2014-09-05 ENCOUNTER — Inpatient Hospital Stay (HOSPITAL_COMMUNITY): Payer: Managed Care, Other (non HMO)

## 2014-09-05 DIAGNOSIS — I25810 Atherosclerosis of coronary artery bypass graft(s) without angina pectoris: Secondary | ICD-10-CM

## 2014-09-05 DIAGNOSIS — I10 Essential (primary) hypertension: Secondary | ICD-10-CM

## 2014-09-05 LAB — BASIC METABOLIC PANEL
Anion gap: 11 (ref 5–15)
BUN: 20 mg/dL (ref 6–23)
CALCIUM: 8.4 mg/dL (ref 8.4–10.5)
CO2: 27 mEq/L (ref 19–32)
CREATININE: 1.03 mg/dL (ref 0.50–1.35)
Chloride: 100 mEq/L (ref 96–112)
GFR calc Af Amer: >90 mL/min (ref >90)
GFR calc non Af Amer: 84 mL/min — ABNORMAL LOW (ref >90)
GLUCOSE: 144 mg/dL — AB (ref 70–99)
Potassium: 4 mEq/L (ref 3.7–5.3)
Sodium: 138 mEq/L (ref 137–147)

## 2014-09-05 LAB — CBC
HCT: 26.1 % — ABNORMAL LOW (ref 39.0–52.0)
HEMOGLOBIN: 9.1 g/dL — AB (ref 13.0–17.0)
MCH: 29.5 pg (ref 26.0–34.0)
MCHC: 34.9 g/dL (ref 30.0–36.0)
MCV: 84.7 fL (ref 78.0–100.0)
Platelets: 156 10*3/uL (ref 150–400)
RBC: 3.08 MIL/uL — ABNORMAL LOW (ref 4.22–5.81)
RDW: 13.2 % (ref 11.5–15.5)
WBC: 10.1 10*3/uL (ref 4.0–10.5)

## 2014-09-05 MED ORDER — AMIODARONE HCL IN DEXTROSE 360-4.14 MG/200ML-% IV SOLN
60.0000 mg/h | INTRAVENOUS | Status: AC
  Administered 2014-09-05 (×2): 60 mg/h via INTRAVENOUS
  Filled 2014-09-05: qty 200

## 2014-09-05 MED ORDER — AMIODARONE HCL 200 MG PO TABS
400.0000 mg | ORAL_TABLET | Freq: Two times a day (BID) | ORAL | Status: DC
  Administered 2014-09-05 – 2014-09-07 (×5): 400 mg via ORAL
  Filled 2014-09-05 (×7): qty 2

## 2014-09-05 MED ORDER — AMIODARONE HCL IN DEXTROSE 360-4.14 MG/200ML-% IV SOLN
30.0000 mg/h | INTRAVENOUS | Status: DC
Start: 2014-09-05 — End: 2014-09-05
  Administered 2014-09-05: 30 mg/h via INTRAVENOUS
  Filled 2014-09-05 (×2): qty 200

## 2014-09-05 MED ORDER — LACTULOSE 10 GM/15ML PO SOLN
10.0000 g | Freq: Every day | ORAL | Status: DC | PRN
  Filled 2014-09-05: qty 15

## 2014-09-05 MED ORDER — AMIODARONE LOAD VIA INFUSION
150.0000 mg | Freq: Once | INTRAVENOUS | Status: AC
  Administered 2014-09-05: 150 mg via INTRAVENOUS
  Filled 2014-09-05: qty 83.34

## 2014-09-05 MED ORDER — MAGNESIUM HYDROXIDE 400 MG/5ML PO SUSP: 15.0000 mL | Freq: Every day | ORAL | Status: DC | PRN

## 2014-09-05 MED FILL — Sodium Bicarbonate IV Soln 8.4%: INTRAVENOUS | Qty: 50 | Status: AC

## 2014-09-05 MED FILL — Electrolyte-R (PH 7.4) Solution: INTRAVENOUS | Qty: 1000 | Status: AC

## 2014-09-05 MED FILL — Sodium Chloride IV Soln 0.9%: INTRAVENOUS | Qty: 2000 | Status: AC

## 2014-09-05 MED FILL — Heparin Sodium (Porcine) Inj 1000 Unit/ML: INTRAMUSCULAR | Qty: 10 | Status: AC

## 2014-09-05 MED FILL — Lidocaine HCl IV Inj 20 MG/ML: INTRAVENOUS | Qty: 5 | Status: AC

## 2014-09-05 MED FILL — Mannitol IV Soln 20%: INTRAVENOUS | Qty: 500 | Status: AC

## 2014-09-05 NOTE — Progress Notes (Signed)
CARDIAC REHAB PHASE I   PRE:  Rate/Rhythm: 90 SR  BP:  Supine: 129/72  Sitting:   Standing:    SaO2: 94% 2L  87%RA  MODE:  Ambulation: 550 ft   POST:  Rate/Rhythm: 97SR  BP:  Supine: 135/77  Sitting:   Standing:    SaO2: 91% 2L hall and room [id number] Tried RA prior to walk but pt desat to 85-87%. Put on 2L and sats back up. Walked without walker and one asst for equipment with steady gait. Tolerated well. Left on 2L and encouraged IS. Pt requested to go back to bed since he did not sleep well last night.     Luetta Nuttingharlene Harlo Jaso, RN BSN  09/05/2014 9:19 AM

## 2014-09-05 NOTE — Progress Notes (Addendum)
       [address]       [place] [id number]             [phone]          3 Days Post-Op Procedure(s) (LRB): CORONARY ARTERY BYPASS GRAFTING (CABG), ON PUMP, TIMES TWO, USING BILATERAL INTERNAL MAMMARY ARTERIES. (N/A) TRANSESOPHAGEAL ECHOCARDIOGRAM (TEE) (N/A)  Subjective: Back in SR this am. Main complaint is constipation.  Not passing flatus and feels bloated. No nausea.   Objective: Vital signs in last 24 hours: Patient Vitals for the past 24 hrs:  BP Temp Temp src Pulse Resp SpO2 Weight  09/05/14 0628 100/61 mmHg 99.2 F (37.3 C) Oral 95 18 92 % -  09/05/14 0430 124/76 mmHg - - 140 - - -  09/05/14 0420 121/69 mmHg - - 100 - 94 % -  09/05/14 0350 98/66 mmHg 100.1 F (37.8 C) Oral 150 20 94 % -  09/05/14 0200 - - - - - - 216 lb 2.9 oz (98.059 kg)  09/05/14 0100 143/76 mmHg 98.7 F (37.1 C) Oral - 20 94 % -  09/04/14 2135 121/74 mmHg 99.3 F (37.4 C) Oral 95 20 95 % -  09/04/14 1631 - 99.1 F (37.3 C) Oral - - - -  09/04/14 1330 - 100.9 F (38.3 C) Axillary - - - -  09/04/14 1231 138/82 mmHg - - 108 21 94 % -  09/04/14 1000 - - - 108 25 93 % -  09/04/14 0900 116/65 mmHg - - 94 27 97 % -  09/04/14 0800 113/62 mmHg - - 81 21 94 % -   Current Weight  09/05/14 216 lb 2.9 oz (98.059 kg)   PRE-OPERATIVE WEIGHT: 98.7 kg   Intake/Output from previous day: 10/11 0701 - 10/12 0700 In: 340 [P.O.:320; I.V.:20] Out: 990 [Urine:990]    PHYSICAL EXAM:  Heart: RRR Lungs: Clear Abdomen: Mildly distended and firm, +few BS, not tender Wound: Intact, small amount serosanguinous drainage from lower portion of sternal wound, no erythema Extremities: No significant LE edema    Lab Results: CBC: Recent Labs  09/04/14 0430 09/05/14 0621  WBC 10.4 10.1  HGB 9.6* 9.1*  HCT 28.0* 26.1*  PLT 131* 156   BMET:  Recent Labs  09/04/14 0430 09/05/14 0621  NA 135* 138  K 4.1 4.0  CL 99 100  CO2 27 27  GLUCOSE 132* 144*  BUN 21 20  CREATININE  1.18 1.03  CALCIUM 8.3* 8.4    PT/INR:  Recent Labs  09/02/14 1300  LABPROT 16.9*  INR 1.37      Assessment/Plan: S/P Procedure(s) (LRB): CORONARY ARTERY BYPASS GRAFTING (CABG), ON PUMP, TIMES TWO, USING BILATERAL INTERNAL MAMMARY ARTERIES. (N/A) TRANSESOPHAGEAL ECHOCARDIOGRAM (TEE) (N/A)  CV- AF, now SR.  Continue IV Amiodarone for now.  BPs stable on Toprol XL, Cozaar.  Vol overload- diurese.  Sternal drainage- likely fat necrosis.  Will monitor.  GI- probable mild ileus. Watch closely. LOC today.  Pulm- continue IS/pulm toilet. CXR not done yet.  Expected postop blood loss anemia- H/H generally stable.  CRPI, ambulation.   LOS: 5 days    COLLINS,GINA H 09/05/2014  Patient seen and examined He feels better this afternoon. + flatus but still no BM yet In SR-amiodarone changed to PO

## 2014-09-05 NOTE — Progress Notes (Signed)
Patient ID: Michael PartridgeDonald E Duran, male   DOB: 08/24/1966, 48 y.o.   MRN: [id number]    Subjective:  Denies SSCP, palpitations or Dyspnea Constipated   Objective:  Filed Vitals:   09/05/14 0350 09/05/14 0420 09/05/14 0430 09/05/14 0628  BP: 98/66 121/69 124/76 100/61  Pulse: 150 100 140 95  Temp: 100.1 F (37.8 C)   99.2 F (37.3 C)  TempSrc: Oral   Oral  Resp: 20   18  Height:      Weight:      SpO2: 94% 94%  92%    Intake/Output from previous day:  Intake/Output Summary (Last 24 hours) at 09/05/14 1153 Last data filed at 09/05/14 0000  Gross per 24 hour  Intake    320 ml  Output    850 ml  Net   -530 ml    Physical Exam:  Affect appropriate Healthy:  appears stated age HEENT: normal Neck supple with no adenopathy JVP normal no bruits no thyromegaly Lungs clear with no wheezing and good diaphragmatic motion Heart:  S1/S2 no murmur, no rub, gallop or click sternum ok post biltateral mammary grafts PMI normal Abdomen: benighn, BS positve, no tenderness, no AAA no bruit.  No HSM or HJR Distal pulses intact with no bruits No edema Neuro non-focal Skin warm and dry No muscular weakness   Lab Results: Basic Metabolic Panel:  Recent Labs  29/56/2109/09/08 0410 09/03/14 1645  09/04/14 0430 09/05/14 0621  NA 140  --   < > 135* 138  K 4.1  --   < > 4.1 4.0  CL 104  --   < > 99 100  CO2 23  --   --  27 27  GLUCOSE 108*  --   < > 132* 144*  BUN 14  --   < > 21 20  CREATININE 0.77 1.10  < > 1.18 1.03  CALCIUM 8.4  --   --  8.3* 8.4  MG 2.1 2.2  --   --   --   < > = values in this interval not displayed. CBC:  Recent Labs  09/04/14 0430 09/05/14 0621  WBC 10.4 10.1  HGB 9.6* 9.1*  HCT 28.0* 26.1*  MCV 85.6 84.7  PLT 131* 156    Imaging: Dg Chest 2 View  09/05/2014   CLINICAL DATA:  Atelectasis both lungs.  Chest pain.  EXAM: CHEST  2 VIEW  COMPARISON:  09/04/2014 .  FINDINGS: Mediastinum hilar structures normal. Prior CABG. Mild cardiomegaly, no pulmonary  venous congestion. Bibasilar subsegmental atelectasis and/or infiltrates with small left pleural effusion.  IMPRESSION: 1. Prior CABG.  Mild cardiomegaly.  No pulmonary venous congestion. 2. Bibasilar atelectasis and/or pneumonia with small left pleural effusion.   Electronically Signed   By: Maisie Fushomas  Register   On: 09/05/2014 11:50   Dg Chest Port 1 View  09/04/2014   CLINICAL DATA:  Postop CABG. Followup atelectasis and left pleural effusion.  EXAM: PORTABLE CHEST - 1 VIEW  COMPARISON:  Portable chest x-rays yesterday and 09/02/2014.  FINDINGS: Sternotomy for CABG. Cardiac silhouette moderately enlarged but stable. Mild pulmonary venous hypertension without overt edema, unchanged. No pneumothorax after left chest tube removal. Stable left pleural effusion and dense consolidation in the left lower lobe. Stable mild linear atelectasis at the right lung base. No new pulmonary parenchymal abnormalities. Swan-Ganz catheter removed. Right jugular introducer sheath tip in the upper SVC.  IMPRESSION: 1. No pneumothorax after left chest tube removal. 2. Stable left pleural effusion and  dense left lower lobe atelectasis. Stable mild linear atelectasis at the right lung base. No new abnormalities.   Electronically Signed   By: Hulan Saashomas  Lawrence M.D.   On: 09/04/2014 08:27    Cardiac Studies:  ECG:   SR rate 71 inferior T wave inversions    Telemetry:  NSR no further PAF   Medications:   . acetaminophen  1,000 mg Oral 4 times per day   Or  . acetaminophen (TYLENOL) oral liquid 160 mg/5 mL  1,000 mg Per Tube 4 times per day  . aspirin EC  325 mg Oral Daily   Or  . aspirin  324 mg Per Tube Daily  . atorvastatin  80 mg Oral q1800  . bisacodyl  10 mg Oral Daily   Or  . bisacodyl  10 mg Rectal Daily  . docusate sodium  200 mg Oral Daily  . enoxaparin (LOVENOX) injection  40 mg Subcutaneous QHS  . losartan  100 mg Oral QHS  . metoprolol succinate  25 mg Oral Daily  . moving right along book   Does not  apply Once  . pantoprazole  40 mg Oral Daily  . sodium chloride  3 mL Intravenous Q12H     . amiodarone 30 mg/hr (09/05/14 1145)    Assessment/Plan:  PAF:  Improved continue amiodarone and beta blocker change to PO 400 bid. For 2 weeks than 400 daily till f/u with Dr Antoine PocheHochrein Constipation:  Bisacodyl and or miralax Chol:  Continue statin  CAD:  Post CABG using both mammary arteries probable d/c in am   Charlton Hawseter Langdon Crosson 09/05/2014, 11:53 AM

## 2014-09-06 DIAGNOSIS — I480 Paroxysmal atrial fibrillation: Secondary | ICD-10-CM

## 2014-09-06 MED ORDER — POLYETHYLENE GLYCOL 3350 17 G PO PACK
17.0000 g | PACK | Freq: Every day | ORAL | Status: DC
  Administered 2014-09-06: 17 g via ORAL
  Filled 2014-09-06 (×2): qty 1

## 2014-09-06 MED ORDER — METOPROLOL SUCCINATE ER 50 MG PO TB24
50.0000 mg | ORAL_TABLET | Freq: Every day | ORAL | Status: DC
  Administered 2014-09-06 – 2014-09-07 (×2): 50 mg via ORAL
  Filled 2014-09-06 (×2): qty 1

## 2014-09-06 MED ORDER — LACTULOSE 10 GM/15ML PO SOLN
30.0000 g | Freq: Every day | ORAL | Status: DC | PRN
  Filled 2014-09-06: qty 45

## 2014-09-06 NOTE — Progress Notes (Signed)
Pt ambulating in hallway independently x2. No assistance needed. Darrel HooverWilson,Fumie Fiallo S 6:41 PM

## 2014-09-06 NOTE — Progress Notes (Signed)
       [address]       [place] [id number]             [phone]          4 Days Post-Op Procedure(s) (LRB): CORONARY ARTERY BYPASS GRAFTING (CABG), ON PUMP, TIMES TWO, USING BILATERAL INTERNAL MAMMARY ARTERIES. (N/A) TRANSESOPHAGEAL ECHOCARDIOGRAM (TEE) (N/A)  Subjective: Still no BM, got 1 dose MOM with no results. Brief runs AF this am, asymptomatic.   Objective: Vital signs in last 24 hours: Patient Vitals for the past 24 hrs:  BP Temp Temp src Pulse Resp SpO2 Weight  09/06/14 0439 130/77 mmHg 100.1 F (37.8 C) Oral 98 20 92 % 214 lb 8 oz (97.297 kg)  09/05/14 2003 118/67 mmHg 98.3 F (36.8 C) Oral 86 18 91 % -   Current Weight  09/06/14 214 lb 8 oz (97.297 kg)  PRE-OPERATIVE WEIGHT: 98.7 kg    Intake/Output from previous day:      PHYSICAL EXAM:  Heart: RRR Lungs: Clear Wound: Intact, still with serosanguinous drainage from lower sternal wound Abdomen: Slightly distended, some BS Extremities: No significant LE edema    Lab Results: CBC: Recent Labs  09/04/14 0430 09/05/14 0621  WBC 10.4 10.1  HGB 9.6* 9.1*  HCT 28.0* 26.1*  PLT 131* 156   BMET:  Recent Labs  09/04/14 0430 09/05/14 0621  NA 135* 138  K 4.1 4.0  CL 99 100  CO2 27 27  GLUCOSE 132* 144*  BUN 21 20  CREATININE 1.18 1.03  CALCIUM 8.3* 8.4    PT/INR: No results found for this basename: LABPROT, INR,  in the last 72 hours    Assessment/Plan: S/P Procedure(s) (LRB): CORONARY ARTERY BYPASS GRAFTING (CABG), ON PUMP, TIMES TWO, USING BILATERAL INTERNAL MAMMARY ARTERIES. (N/A) TRANSESOPHAGEAL ECHOCARDIOGRAM (TEE) (N/A) CV- Continues to have runs of AF. Continue po Amiodarone, will increase Toprol XL, Continue Cozaar.  Vol overload- diurese.  Sternal drainage- Still with some lower wound drainage, no erythema. Continue to monitor.  GI- probable mild ileus, but bowel sounds increased today. Passing flatus.  Repeat LOC today.  Pulm- continue IS/pulm  toilet. Wean O2 as able.  CRPI, ambulation. Home 1-2 days if bowels working and rhythm stable.      LOS: 6 days    Michael Duran H 09/06/2014

## 2014-09-06 NOTE — Progress Notes (Signed)
Student nurse's assessment reviewed and agree with findings. Darrel HooverWilson,Chi Woodham S 1:38 PM

## 2014-09-06 NOTE — Progress Notes (Signed)
Patient ID: Michael Duran, male   DOB: 10/14/1966, 48 y.o.   MRN: [id number]    Subjective:  Overall improving less constipation some palpitations   Objective:  Filed Vitals:   09/05/14 0628 09/05/14 2003 09/06/14 0439 09/06/14 0910  BP: 100/61 118/67 130/77   Pulse: 95 86 98 144  Temp: 99.2 F (37.3 C) 98.3 F (36.8 C) 100.1 F (37.8 C)   TempSrc: Oral Oral Oral   Resp: 18 18 20    Height:      Weight:   214 lb 8 oz (97.297 kg)   SpO2: 92% 91% 92%     Intake/Output from previous day:  Intake/Output Summary (Last 24 hours) at 09/06/14 1150 Last data filed at 09/06/14 0815  Gross per 24 hour  Intake    120 ml  Output      0 ml  Net    120 ml    Physical Exam:  Affect appropriate Healthy:  appears stated age HEENT: normal Neck supple with no adenopathy JVP normal no bruits no thyromegaly Lungs clear with no wheezing and good diaphragmatic motion Heart:  S1/S2 no murmur, no rub, gallop or click sternum ok post biltateral mammary grafts PMI normal Abdomen: benighn, BS positve, no tenderness, no AAA no bruit.  No HSM or HJR Distal pulses intact with no bruits No edema Neuro non-focal Skin warm and dry No muscular weakness   Lab Results: Basic Metabolic Panel:  Recent Labs  91/47/8209/09/08 1645  09/04/14 0430 09/05/14 0621  NA  --   < > 135* 138  K  --   < > 4.1 4.0  CL  --   < > 99 100  CO2  --   --  27 27  GLUCOSE  --   < > 132* 144*  BUN  --   < > 21 20  CREATININE 1.10  < > 1.18 1.03  CALCIUM  --   --  8.3* 8.4  MG 2.2  --   --   --   < > = values in this interval not displayed. CBC:  Recent Labs  09/04/14 0430 09/05/14 0621  WBC 10.4 10.1  HGB 9.6* 9.1*  HCT 28.0* 26.1*  MCV 85.6 84.7  PLT 131* 156    Imaging: Dg Chest 2 View  09/05/2014   CLINICAL DATA:  Atelectasis both lungs.  Chest pain.  EXAM: CHEST  2 VIEW  COMPARISON:  09/04/2014 .  FINDINGS: Mediastinum hilar structures normal. Prior CABG. Mild cardiomegaly, no pulmonary venous  congestion. Bibasilar subsegmental atelectasis and/or infiltrates with small left pleural effusion.  IMPRESSION: 1. Prior CABG.  Mild cardiomegaly.  No pulmonary venous congestion. 2. Bibasilar atelectasis and/or pneumonia with small left pleural effusion.   Electronically Signed   By: Maisie Fushomas  Register   On: 09/05/2014 11:50    Cardiac Studies:  ECG:   SR rate 71 inferior T wave inversions    Telemetry:  Bouts of SVT and paroxysmal flutter   Medications:   . acetaminophen  1,000 mg Oral 4 times per day   Or  . acetaminophen (TYLENOL) oral liquid 160 mg/5 mL  1,000 mg Per Tube 4 times per day  . amiodarone  400 mg Oral BID  . aspirin EC  325 mg Oral Daily   Or  . aspirin  324 mg Per Tube Daily  . atorvastatin  80 mg Oral q1800  . bisacodyl  10 mg Oral Daily   Or  . bisacodyl  10 mg Rectal  Daily  . docusate sodium  200 mg Oral Daily  . enoxaparin (LOVENOX) injection  40 mg Subcutaneous QHS  . losartan  100 mg Oral QHS  . metoprolol succinate  50 mg Oral Daily  . moving right along book   Does not apply Once  . pantoprazole  40 mg Oral Daily  . polyethylene glycol  17 g Oral Daily  . sodium chloride  3 mL Intravenous Q12H        Assessment/Plan:  PAF:  Increased beta blocker continue amiodarone  400 bid for 2 weeks than 400 daily till f/u with Dr Antoine PocheHochrein Constipation:  Bisacodyl and or miralax Chol:  Continue statin  CAD:  Post CABG using both mammary arteries probable d/c in 24-48 hours pending rhythm   Michael Duran 09/06/2014, 11:50 AM

## 2014-09-06 NOTE — Progress Notes (Signed)
Pt ambulating independently accompanied by wife. Pt tolerated ambulation well and is currently resting in bed with call bell within reach. Will continue to monitor.   Imanie Darrow, Dot Beenreama R, RN

## 2014-09-06 NOTE — Discharge Summary (Signed)
[address]       [place] [address]             [phone]              Discharge Summary  Name: Michael Duran DOB: February 02, 1966 48 y.o. MRN: [id number]   Admission Date: 08/31/2014 Discharge Date: 09/07/2014    Admitting Diagnosis: Chest pain   Discharge Diagnosis:  Severe 2 vessel coronary artery disease Class III angina Expected postoperative blood loss anemia Postoperative atrial fibrillation  Past Medical History  Diagnosis Date  . Hyperlipemia     mild  . HTN (hypertension)   . Gout   . Kidney stone     "passed it"  . Coronary artery disease   . Sleep apnea     not an issue with weight loss in 2006  . Diabetes     not since losing weight in 2006  . Chronic lower back pain       Procedures: CORONARY ARTERY BYPASS GRAFTING (Left internal mammary artery to left anterior descending, free right internal mammary artery to posterior descending)  - 09/02/2014   HPI:  The patient is a 48 y.o. male with no prior history of coronary disease. He has a strong family history of coronary disease, hypertension and hyperlipidemia. He was diagnosed at one time with diabetes, but never required medication. He lost 70 pounds with diet and weight loss, and his A1C has been normal since then. He has been having exertional chest discomfort. This does not occur with normal activities, but if walks quickly for exercise he will get substernal chest pressure that is relieved by rest. He says his heart rate has to be about 120 before this will happen. He does not describe associated symptoms such as nausea, vomiting or diaphoresis. He has not had pain at rest. He does not have shortness of breath and he denies any PND or orthopnea. He was referred to Dr. Antoine Poche for further evaluation and underwent a stress test which showed anterior and inferior ischemia with EKG changes during exercise. Cardiac catheterization was performed on 08/31/2014 which showed severe  2 vessel CAD of the proximal LAD and distal RCA. LV function was preserved. The patient was admitted following catheterization for cardiac surgery evaluation and treatment.    Hospital Course:  The patient was admitted to [facility] on 08/31/2014. Dr. Dorris Fetch saw the patient in consultation and reviewed his films.  It was felt that CABG was indicated at this time for survival benefit and relief of symptoms. All risks, benefits and alternatives of surgery were explained in detail, and the patient agreed to proceed.   The patient was taken to the operating room and underwent the above procedure.    The postoperative course has been notable for atrial fibrillation.  He was started on Amiodarone and a beta blocker, and converted to normal sinus rhythm.  He has had some brief intermittent runs of a-fib since then, and his medication doses have been adjusted accordingly.  He also had a mild ileus which was treated conservatively and has resolved.  Overall, the patient is doing well postoperatively.  He is ambulating in the halls without difficulty and is tolerating a regular diet.  Incisions are healing well, although he has had some serosanguinous drainage from the lower portion of the sternal wound without evidence of infection.  He has been weaned from supplemental oxygen.  He is maintaining normal sinus rhythm.  The patient has  been evaluated on today's date and is medically stable for discharge home.    Recent vital signs:  Filed Vitals:   09/07/14 0648  BP: 131/86  Pulse: 77  Temp: 97.9 F (36.6 C)  Resp:     Recent laboratory studies:  CBC:  Recent Labs  09/05/14 0621  WBC 10.1  HGB 9.1*  HCT 26.1*  PLT 156   BMET:   Recent Labs  09/05/14 0621  NA 138  K 4.0  CL 100  CO2 27  GLUCOSE 144*  BUN 20  CREATININE 1.03  CALCIUM 8.4    PT/INR: No results found for this basename: LABPROT, INR,  in the last 72 hours   Discharge Medications:     Medication List      STOP taking these medications       aspirin 81 MG tablet  Replaced by:  aspirin 325 MG EC tablet     ibuprofen 800 MG tablet  Commonly known as:  ADVIL,MOTRIN     sodium-potassium bicarbonate Tbef dissolvable tablet  Commonly known as:  ALKA-SELTZER GOLD     TYLENOL COLD HEAD CONGESTION 03-26-09-325 MG Tabs  Generic drug:  Phenyleph-CPM-DM-APAP      TAKE these medications       amiodarone 400 MG tablet  Commonly known as:  PACERONE  Take 1 tablet (400 mg total) by mouth 2 (two) times daily. X 2 weeks, then decrease to 400 mg po daily     aspirin 325 MG EC tablet  Take 1 tablet (325 mg total) by mouth daily.     atorvastatin 80 MG tablet  Commonly known as:  LIPITOR  Take 1 tablet (80 mg total) by mouth daily.     losartan 50 MG tablet  Commonly known as:  COZAAR  Take 100 mg by mouth daily.     metoprolol succinate 50 MG 24 hr tablet  Commonly known as:  TOPROL-XL  Take 1 tablet (50 mg total) by mouth daily. Take with or immediately following a meal.     oxyCODONE 5 MG immediate release tablet  Commonly known as:  Oxy IR/ROXICODONE  Take 1-2 tablets (5-10 mg total) by mouth every 3 (three) hours as needed for severe pain.         Discharge Instructions:  The patient is to refrain from driving, heavy lifting or strenuous activity.  May shower daily and clean incisions with soap and water.  May resume regular diet.   Follow Up:   Follow-up Information   Follow up with Rollene RotundaJames Hochrein, MD On 09/20/2014. (Appointment is  10/27 @ 9 am with Dr Antoine PocheHochrein [facility] )    Specialty:  Cardiology   Contact information:   [address] [place] [address] [phone]       Follow up with Loreli SlotHENDRICKSON,Benedicta Sultan C, MD On 10/11/2014. ([facility] at 10:30 , then see MD at 11:30)    Specialty:  Cardiothoracic Surgery   Contact information:   [address] [place] [address] [phone]        The  patient has been discharged on:  1.Beta Blocker: Yes [ x ]  No [ ]   If No, reason:    2.Ace Inhibitor/ARB: Yes [ x ]  No [  ]  If No, reason:    3.Statin: Yes [ x ]  No [ ]   If No, reason:    4.Ecasa: Yes [ x ]  No [ ]   If No, reason:   COLLINS,GINA H 09/07/2014, 8:42 AM

## 2014-09-06 NOTE — Progress Notes (Signed)
1335 Came to see pt to walk. Pt stated he walked earlier (690 ft). Now he is full of laxatives and stated feels bloated. Will walk later when he feels better. Will follow up tomorrow. Luetta Nuttingharlene Faustine Tates RN BSN 09/06/2014 1:37 PM

## 2014-09-07 MED ORDER — METOPROLOL SUCCINATE ER 50 MG PO TB24: 50.0000 mg | ORAL_TABLET | Freq: Every day | ORAL | Status: DC

## 2014-09-07 MED ORDER — OXYCODONE HCL 5 MG PO TABS: 5.0000 mg | ORAL_TABLET | ORAL | Status: DC | PRN

## 2014-09-07 MED ORDER — ATORVASTATIN CALCIUM 80 MG PO TABS: 80.0000 mg | ORAL_TABLET | Freq: Every day | ORAL | Status: DC

## 2014-09-07 MED ORDER — ASPIRIN 325 MG PO TBEC: 325.0000 mg | DELAYED_RELEASE_TABLET | Freq: Every day | ORAL | Status: DC

## 2014-09-07 MED ORDER — AMIODARONE HCL 400 MG PO TABS: 400.0000 mg | ORAL_TABLET | Freq: Two times a day (BID) | ORAL | Status: DC

## 2014-09-07 NOTE — Progress Notes (Addendum)
       [address]       [place] [id number]             [phone]          5 Days Post-Op Procedure(s) (LRB): CORONARY ARTERY BYPASS GRAFTING (CABG), ON PUMP, TIMES TWO, USING BILATERAL INTERNAL MAMMARY ARTERIES. (N/A) TRANSESOPHAGEAL ECHOCARDIOGRAM (TEE) (N/A)  Subjective: Feels much better today.  +BM.  Ready to go home.   Objective: Vital signs in last 24 hours: Patient Vitals for the past 24 hrs:  BP Temp Temp src Pulse Resp SpO2 Weight  09/07/14 0648 131/86 mmHg 97.9 F (36.6 C) Oral 77 - 96 % -  09/07/14 0500 - - - - - - 212 lb 3.2 oz (96.253 kg)  09/07/14 0241 - 98.5 F (36.9 C) Oral - - - -  09/06/14 2056 125/82 mmHg 98.1 F (36.7 C) Oral 76 18 95 % -  09/06/14 1431 129/92 mmHg 97.9 F (36.6 C) Oral 77 18 98 % -  09/06/14 0910 - - - 144 - - -   Current Weight  09/07/14 212 lb 3.2 oz (96.253 kg)  PRE-OPERATIVE WEIGHT: 98.7 kg    Intake/Output from previous day: 10/13 0701 - 10/14 0700 In: 240 [P.O.:240] Out: -     PHYSICAL EXAM:  Heart: RRR Lungs: Clear Wound: Intact, small amount serosanguinous drainage from lower sternal wound, no erythema Abdomen: Soft, nontender, nondistended, +BS Extremities: No edema    Lab Results: CBC: Recent Labs  09/05/14 0621  WBC 10.1  HGB 9.1*  HCT 26.1*  PLT 156   BMET:  Recent Labs  09/05/14 0621  NA 138  K 4.0  CL 100  CO2 27  GLUCOSE 144*  BUN 20  CREATININE 1.03  CALCIUM 8.4    PT/INR: No results found for this basename: LABPROT, INR,  in the last 72 hours    Assessment/Plan: S/P Procedure(s) (LRB): CORONARY ARTERY BYPASS GRAFTING (CABG), ON PUMP, TIMES TWO, USING BILATERAL INTERNAL MAMMARY ARTERIES. (N/A) TRANSESOPHAGEAL ECHOCARDIOGRAM (TEE) (N/A) CV- No further AF. Continue po Amiodarone, Toprol XL, Cozaar.  Sternal drainage- nearly resolved, no erythema. Continue to monitor.   D/c EPWs this am. Home later today- instruction reviewed with patient and wife.   LOS:  7 days    COLLINS,GINA H 09/07/2014  Patient seen and examined, agree with above Dc EPW Home today

## 2014-09-07 NOTE — Progress Notes (Signed)
Discharge education, medication, and prescriptions given to pt and pts wife, post-op education given and reinforced, both stated understanding and that they had no questions, follow-up appts given to pt Michael Duran, Larrisa Cravey G, RN

## 2014-09-07 NOTE — Progress Notes (Signed)
IV and tele removed, pt voiced concern about midline incision draining, called Almira CoasterGina PA and Almira CoasterGina stated it was okay when she saw it this morning and to proceed with discharge, PA stated to apply dry dressing as need for drainage Michael Duran, Michael Wiegert G, RN

## 2014-09-07 NOTE — Progress Notes (Signed)
[id number] Education completed with pt and wife who voiced understanding. Encouraged IS. Discussed CRP 2 and pt would [facility]. Pt following heart healthy diet. Luetta NuttingCharlene Jayanna Kroeger RN BSN 09/07/2014 10:32 AM

## 2014-09-07 NOTE — Progress Notes (Signed)
Patient ID: Michael PartridgeDonald E Duran, male   DOB: 03/13/1966, 48 y.o.   MRN: [id number]    Subjective:  Going home today pacing wires out   Objective:  Filed Vitals:   09/07/14 0851 09/07/14 0900 09/07/14 0915 09/07/14 0927  BP: 153/89 146/89 133/82 138/95  Pulse: 74 72 76 74  Temp:      TempSrc:      Resp:      Height:      Weight:      SpO2:        Intake/Output from previous day:  Intake/Output Summary (Last 24 hours) at 09/07/14 0943 Last data filed at 09/06/14 1300  Gross per 24 hour  Intake    120 ml  Output      0 ml  Net    120 ml    Physical Exam:  Affect appropriate Healthy:  appears stated age HEENT: normal Neck supple with no adenopathy JVP normal no bruits no thyromegaly Lungs clear with no wheezing and good diaphragmatic motion Heart:  S1/S2 no murmur, no rub, gallop or click sternum ok post biltateral mammary grafts PMI normal Abdomen: benighn, BS positve, no tenderness, no AAA no bruit.  No HSM or HJR Distal pulses intact with no bruits No edema Neuro non-focal Skin warm and dry No muscular weakness   Lab Results: Basic Metabolic Panel:  Recent Labs  40/98/1109/11/08 0621  NA 138  K 4.0  CL 100  CO2 27  GLUCOSE 144*  BUN 20  CREATININE 1.03  CALCIUM 8.4   CBC:  Recent Labs  09/05/14 0621  WBC 10.1  HGB 9.1*  HCT 26.1*  MCV 84.7  PLT 156    Imaging: Dg Chest 2 View  09/05/2014   CLINICAL DATA:  Atelectasis both lungs.  Chest pain.  EXAM: CHEST  2 VIEW  COMPARISON:  09/04/2014 .  FINDINGS: Mediastinum hilar structures normal. Prior CABG. Mild cardiomegaly, no pulmonary venous congestion. Bibasilar subsegmental atelectasis and/or infiltrates with small left pleural effusion.  IMPRESSION: 1. Prior CABG.  Mild cardiomegaly.  No pulmonary venous congestion. 2. Bibasilar atelectasis and/or pneumonia with small left pleural effusion.   Electronically Signed   By: Maisie Fushomas  Register   On: 09/05/2014 11:50    Cardiac Studies:  ECG:   SR rate 71  inferior T wave inversions    Telemetry:  Improved SR 70's  No flutter   Medications:   . acetaminophen  1,000 mg Oral 4 times per day   Or  . acetaminophen (TYLENOL) oral liquid 160 mg/5 mL  1,000 mg Per Tube 4 times per day  . amiodarone  400 mg Oral BID  . aspirin EC  325 mg Oral Daily   Or  . aspirin  324 mg Per Tube Daily  . atorvastatin  80 mg Oral q1800  . bisacodyl  10 mg Oral Daily   Or  . bisacodyl  10 mg Rectal Daily  . docusate sodium  200 mg Oral Daily  . enoxaparin (LOVENOX) injection  40 mg Subcutaneous QHS  . losartan  100 mg Oral QHS  . metoprolol succinate  50 mg Oral Daily  . moving right along book   Does not apply Once  . pantoprazole  40 mg Oral Daily  . polyethylene glycol  17 g Oral Daily  . sodium chloride  3 mL Intravenous Q12H        Assessment/Plan:  PAF:  Increased beta blocker 50 mg  continue amiodarone  400 bid for 2 weeks  than 400 daily till f/u with Dr Antoine PocheHochrein 10/27 Constipation:  Bisacodyl and or miralax Chol:  Continue statin  CAD:  Post CABG using both mammary arteries d/c home today   Michael Duran 09/07/2014, 9:43 AM

## 2014-09-07 NOTE — Progress Notes (Signed)
Chest tube sutures removed per order and protocol, steri strips applied, pacing wires removed per order and protocol, pacing wires intact upon removal, pt tolerated well, pt made aware of bedrest for 1 hour and pt stated understanding, will continue to monitor closely Archie BalboaStein, Cataleyah Colborn G, RN

## 2014-09-08 ENCOUNTER — Telehealth: Payer: Self-pay | Admitting: Cardiology

## 2014-09-08 NOTE — Telephone Encounter (Signed)
Pt had by pass surgery last Friday. While he was in the hospital,he could not sleep. They gave him Ambien,still not sleeping. He would like a prescription for Ambien please. His surgeon told him to call here for the Ambien.Please call to [facility].

## 2014-09-08 NOTE — Telephone Encounter (Signed)
Returned call to patient's wife she stated husband is having trouble sleeping.Stated he did not sleep last night.Stated he was given Ambien in the hospital and that helped.Stated she wanted to ask Dr.Hochrein if he would prescribe Ambien.Message sent to Dr.Hochrein.

## 2014-09-12 MED FILL — Cefuroxime Sodium For Inj 750 MG: INTRAMUSCULAR | Qty: 750 | Status: AC

## 2014-09-12 MED FILL — Potassium Chloride Inj 2 mEq/ML: INTRAVENOUS | Qty: 40 | Status: AC

## 2014-09-12 MED FILL — Heparin Sodium (Porcine) Inj 1000 Unit/ML: INTRAMUSCULAR | Qty: 30 | Status: AC

## 2014-09-12 MED FILL — Magnesium Sulfate Inj 50%: INTRAMUSCULAR | Qty: 10 | Status: AC

## 2014-09-13 ENCOUNTER — Encounter: Payer: Self-pay | Admitting: *Deleted

## 2014-09-14 NOTE — Telephone Encounter (Signed)
OK to prescribe ambien.  5 mg po qhs prn.  Disp number 31.  No refills.

## 2014-09-15 ENCOUNTER — Other Ambulatory Visit: Payer: Self-pay | Admitting: *Deleted

## 2014-09-15 MED ORDER — ZOLPIDEM TARTRATE 5 MG PO TABS: 5.0000 mg | ORAL_TABLET | Freq: Every evening | ORAL | Status: DC | PRN

## 2014-09-15 NOTE — Telephone Encounter (Signed)
[place]

## 2014-09-15 NOTE — Telephone Encounter (Signed)
Pt. Called and informed about his script

## 2014-09-20 ENCOUNTER — Ambulatory Visit (INDEPENDENT_AMBULATORY_CARE_PROVIDER_SITE_OTHER): Payer: Managed Care, Other (non HMO) | Admitting: Cardiology

## 2014-09-20 ENCOUNTER — Encounter: Payer: Self-pay | Admitting: Cardiology

## 2014-09-20 VITALS — BP 127/85 | HR 70 | Ht 70.0 in | Wt 201.0 lb

## 2014-09-20 DIAGNOSIS — E785 Hyperlipidemia, unspecified: Secondary | ICD-10-CM

## 2014-09-20 DIAGNOSIS — I2510 Atherosclerotic heart disease of native coronary artery without angina pectoris: Secondary | ICD-10-CM | POA: Insufficient documentation

## 2014-09-20 NOTE — Progress Notes (Signed)
HPI The patient for followup after CABG. He had some exertional chest pain and had ischemia on stress perfusion imaging. He is status post CABG as described. He has done well with this. He did have postoperative atrial fibrillation with amiodarone. He denies any chest pressure, neck or arm discomfort. He has some sternal wound discomfort. He's not noticed any further palpitations, presyncope or syncope. He's had no new shortness of breath, PND or orthopnea. He stumbled of walking.  No Known Allergies  Current Outpatient Prescriptions  Medication Sig Dispense Refill  . amiodarone (PACERONE) 400 MG tablet Take 1 tablet (400 mg total) by mouth 2 (two) times daily. X 2 weeks, then decrease to 400 mg po daily  60 tablet  1  . aspirin EC 325 MG EC tablet Take 1 tablet (325 mg total) by mouth daily.  30 tablet  0  . atorvastatin (LIPITOR) 80 MG tablet Take 1 tablet (80 mg total) by mouth daily.  30 tablet  1  . losartan (COZAAR) 50 MG tablet Take 100 mg by mouth daily.      . metoprolol succinate (TOPROL-XL) 50 MG 24 hr tablet Take 1 tablet (50 mg total) by mouth daily. Take with or immediately following a meal.  30 tablet  1  . zolpidem (AMBIEN) 5 MG tablet Take 1 tablet (5 mg total) by mouth at bedtime as needed for sleep.  30 tablet  0   No current facility-administered medications for this visit.    Past Medical History  Diagnosis Date  . Hyperlipemia     mild  . HTN (hypertension)   . Gout   . Kidney stone     "passed it"  . Coronary artery disease   . Sleep apnea     not an issue with weight loss in 2006  . Diabetes     not since losing weight in 2006  . Chronic lower back pain     Past Surgical History  Procedure Laterality Date  . Lumbar laminectomy  11/2003  . Cardiac catheterization  08/31/2014  . Back surgery    . Tonsillectomy  ~ 1970  . Coronary artery bypass graft N/A 09/02/2014    Procedure: CORONARY ARTERY BYPASS GRAFTING (CABG), ON PUMP, TIMES TWO, USING BILATERAL  INTERNAL MAMMARY ARTERIES.;  Surgeon: Loreli SlotSteven C Hendrickson, MD;  Location: [facility] OR;  Service: Open Heart Surgery;  Laterality: N/A;  CABG w/ bilateral mammaries  . Tee without cardioversion N/A 09/02/2014    Procedure: TRANSESOPHAGEAL ECHOCARDIOGRAM (TEE);  Surgeon: Loreli SlotSteven C Hendrickson, MD;  [facility];  Service: Open Heart Surgery;  Laterality: N/A;    ROS:  As stated in the HPI and negative for all other systems.  PHYSICAL EXAM BP 127/85  Pulse 70  Ht 5\' 10"  (1.778 m)  Wt 201 lb (91.173 kg)  BMI 28.84 kg/m2 GENERAL:  Well appearing NECK:  No jugular venous distention, waveform within normal limits, carotid upstroke brisk and symmetric, no bruits, no thyromegaly LUNGS:  Clear to auscultation bilaterally CHEST:  Well healed sternotomy scar. HEART:  PMI not displaced or sustained,S1 and S2 within normal limits, no S3, no S4, no clicks, no rubs,  no murmurs ABD:  Flat, positive bowel sounds normal in frequency in pitch, no bruits, no rebound, no guarding, no midline pulsatile mass, no hepatomegaly, no splenomegaly EXT:  2 plus pulses throughout, no edema, no cyanosis no clubbing   ASSESSMENT AND PLAN   CAD:  The patient is doing well status post CABG. He  is encouraged to stay in cardiac rehabilitation. We will continue with aggressive risk reduction.  DYSLIPIDEMIA:  He will come back in 6 weeks for fasting lipid profile.  HTN:  The blood pressure is at target. No change in medications is indicated. We will continue with therapeutic lifestyle changes (TLC).  ATRIAL FIB:  I will reduce his amiodarone to 400 mg daily today. 200 mg daily for 2 weeks. This can likely be discontinued when he sees Dr. Dorris FetchHendrickson later next month.

## 2014-09-20 NOTE — Patient Instructions (Signed)
Your physician recommends that you schedule a follow-up appointment in: 3 months with Dr. Antoine PocheHochrein  Decrease your Amiodarone to 400 mg Daily  In two weeks decrease the Amiodarone to 200 mg  Have your lipid panel done in 6 weeks

## 2014-09-23 ENCOUNTER — Telehealth: Payer: Self-pay | Admitting: Cardiology

## 2014-09-23 NOTE — Telephone Encounter (Signed)
Faxed requested information to doctor.  09/23/2014.  cbr

## 2014-09-29 ENCOUNTER — Telehealth: Payer: Self-pay | Admitting: Cardiology

## 2014-09-29 NOTE — Telephone Encounter (Signed)
Michael CorporalKaren [facility]  Form needed- Referral form needed to be completed by Dr. Antoine PocheHochrein Faxed this form over on 09/22/14.  Fax# [phone]  Michael Busmandvised Michael that will look for form and have Dr. Antoine PocheHochrein address tomorrow 09/30/2014 during clinic and fax back. If it cannot be found will request another to be faxed.

## 2014-09-30 NOTE — Telephone Encounter (Signed)
Received a fax referral form from [facility] for Dr.Hochrein to sign and requesting patient's last office note,discharge summary,lab results.Form signed by Dr.Hochrein and records requested faxed to fax # [phone].

## 2014-10-07 ENCOUNTER — Other Ambulatory Visit: Payer: Self-pay | Admitting: Thoracic Surgery (Cardiothoracic Vascular Surgery)

## 2014-10-07 DIAGNOSIS — I25119 Atherosclerotic heart disease of native coronary artery with unspecified angina pectoris: Secondary | ICD-10-CM

## 2014-10-11 ENCOUNTER — Ambulatory Visit
Admission: RE | Admit: 2014-10-11 | Discharge: 2014-10-11 | Disposition: A | Discharge status: Home / Self Care | Payer: Managed Care, Other (non HMO) | Source: Ambulatory Visit | Attending: Thoracic Surgery (Cardiothoracic Vascular Surgery) | Admitting: Thoracic Surgery (Cardiothoracic Vascular Surgery)

## 2014-10-11 ENCOUNTER — Other Ambulatory Visit: Payer: Self-pay | Admitting: Thoracic Surgery (Cardiothoracic Vascular Surgery)

## 2014-10-11 ENCOUNTER — Encounter: Payer: Self-pay | Admitting: Thoracic Surgery (Cardiothoracic Vascular Surgery)

## 2014-10-11 ENCOUNTER — Ambulatory Visit (INDEPENDENT_AMBULATORY_CARE_PROVIDER_SITE_OTHER): Payer: Self-pay | Admitting: Thoracic Surgery (Cardiothoracic Vascular Surgery)

## 2014-10-11 VITALS — BP 160/105 | HR 68 | Resp 20 | Ht 70.0 in | Wt 204.0 lb

## 2014-10-11 DIAGNOSIS — I25119 Atherosclerotic heart disease of native coronary artery with unspecified angina pectoris: Secondary | ICD-10-CM

## 2014-10-11 DIAGNOSIS — Z951 Presence of aortocoronary bypass graft: Secondary | ICD-10-CM

## 2014-10-11 LAB — URINALYSIS, ROUTINE W REFLEX MICROSCOPIC
BILIRUBIN URINE: NEGATIVE
Glucose, UA: NEGATIVE mg/dL
Hgb urine dipstick: NEGATIVE
Ketones, ur: NEGATIVE mg/dL
Leukocytes, UA: NEGATIVE
Nitrite: NEGATIVE
Protein, ur: NEGATIVE mg/dL
Specific Gravity, Urine: 1.013 (ref 1.005–1.030)
Urobilinogen, UA: 0.2 mg/dL (ref 0.0–1.0)
pH: 5.5 (ref 5.0–8.0)

## 2014-10-11 NOTE — Progress Notes (Signed)
HPI:  [name] returns today for scheduled postoperative follow-up visit.  He is a 48 year old man who underwent coronary bypass grafting 2 with bilateral mammaries on 09/02/2014. He had atrial fibrillation postoperatively. I converted to sinus rhythm with amiodarone.  He saw Dr. Antoine PocheHochrein 2 weeks ago and his amiodarone was decreased to 200 mg twice a day.  He feels that he is doing well overall. He is up to walking about a mile at a time presently. He stopped taking his pain medication a couple of days after he got home. He does still have some discomfort which he describes this primarily numbness. His primary complaint is urinary frequency and nocturia. He says this started about a week after he got home.  Past Medical History  Diagnosis Date  . Hyperlipemia     mild  . HTN (hypertension)   . Gout   . Kidney stone     "passed it"  . Coronary artery disease   . Sleep apnea     not an issue with weight loss in 2006  . Diabetes     not since losing weight in 2006  . Chronic lower back pain       Current Outpatient Prescriptions  Medication Sig Dispense Refill  . amiodarone (PACERONE) 400 MG tablet Take 1 tablet (400 mg total) by mouth 2 (two) times daily. X 2 weeks, then decrease to 400 mg po daily (Patient taking differently: Take 200 mg by mouth daily. ) 60 tablet 1  . aspirin EC 325 MG EC tablet Take 1 tablet (325 mg total) by mouth daily. 30 tablet 0  . atorvastatin (LIPITOR) 80 MG tablet Take 1 tablet (80 mg total) by mouth daily. 30 tablet 1  . losartan (COZAAR) 50 MG tablet Take 100 mg by mouth daily.    . metoprolol succinate (TOPROL-XL) 50 MG 24 hr tablet Take 1 tablet (50 mg total) by mouth daily. Take with or immediately following a meal. 30 tablet 1  . zolpidem (AMBIEN) 5 MG tablet Take 1 tablet (5 mg total) by mouth at bedtime as needed for sleep. 30 tablet 0   No current facility-administered medications for this visit.    Physical Exam BP 160/105 mmHg  Pulse  68  Resp 20  Ht 5\' 10"  (1.778 m)  Wt 204 lb (92.534 kg)  BMI 29.27 kg/m2  SpO692 1498% 48 year old man in no acute distress Alert and oriented 3 with no focal deficits Cardiac regular rate and rhythm normal S1 and S2, 2/6 systolic murmur Lungs slightly diminished at left base otherwise clear Sternum stable, incision clean dry and intact No peripheral edema  Diagnostic Tests: Chest x-ray has not yet been officially read, but no significant effusions or infiltrates are seen.  Impression: 48 year old man who is now almost 6 weeks out from coronary bypass grafting 2 with bilateral mammary arteries. He is doing well currently. He has minimal incisional discomfort and has started back to work. He is interested in cardiac rehabilitation, but [facility] because is only about 9 minutes from his house. I told him that would be fine as long as he does the cardiac rehabilitation.  He did have atrial fibrillation in the early postoperative period. He has not had any since discharge. I think at this time we can go ahead and stop his amiodarone.  His blood pressure was elevated today at 160/105. It was not elevated when he saw Dr. Antoine PocheHochrein recently. He has a blood pressure cuff at home.  He is going to check that over the next few days and if it remains elevated he will give one of us a call so that we can adjust his medications.  He may begin driving. Appropriate precautions were discussed. Friday will be 6 weeks from his surgery and at that time he will not have any strictures on his activities, but should resume activities gradually.  His primary complaint today is urinary frequency. He could have a UTI secondary to the Foley catheter time of surgery. I'm going to check a urinalysis and urine culture on him today.  Plan: Cardiac rehabilitation  He will follow-up with Dr. Antoine PocheHochrein  I will be happy to see him back at any time if I can be of any further assistance with his care.

## 2014-10-12 LAB — URINE CULTURE
Colony Count: NO GROWTH
Organism ID, Bacteria: NO GROWTH

## 2014-10-13 ENCOUNTER — Telehealth: Payer: Self-pay | Admitting: Cardiology

## 2014-10-13 NOTE — Telephone Encounter (Signed)
[facility] nurse.

## 2014-10-13 NOTE — Telephone Encounter (Signed)
Pt called in wanting to speak with Dr. Jenene SlickerHochrein's nurse about getting a referral to go [facility] cardiac [facility]. He would [facility] to [facility] because he [place] in [place] and it is more convenient. The number to the [facility] facility is [phone]  Thanks

## 2014-10-14 NOTE — Telephone Encounter (Signed)
I talked to Joyce GrossKay about this and pt. Informed about length of time it will take to get this done

## 2014-10-14 NOTE — Telephone Encounter (Signed)
I have called [facility] and left a message to call me about getting him into Cardiac rehab because it's closer to his house

## 2014-10-18 ENCOUNTER — Telehealth: Payer: Self-pay | Admitting: Cardiology

## 2014-10-18 NOTE — Telephone Encounter (Signed)
Pt. Informed that Joyce GrossKay hasn't heard anything yet as far as the referral but when we do he will be the first to know

## 2014-10-18 NOTE — Telephone Encounter (Signed)
Please call,said you were suppose to be getting back to him about a referral.

## 2014-10-25 ENCOUNTER — Other Ambulatory Visit: Payer: Self-pay | Admitting: *Deleted

## 2014-10-25 ENCOUNTER — Telehealth: Payer: Self-pay | Admitting: *Deleted

## 2014-10-25 DIAGNOSIS — I10 Essential (primary) hypertension: Secondary | ICD-10-CM

## 2014-10-25 MED ORDER — AMLODIPINE BESYLATE 10 MG PO TABS: 10.0000 mg | ORAL_TABLET | Freq: Every day | ORAL | Status: DC

## 2014-10-25 NOTE — Telephone Encounter (Signed)
Mr. Michael Duran called with concerns of his continued elevated blood pressure readings.  At his last visit with Dr. Dorris FetchHendrickson he was told to call back with his readings and Dr. Dorris FetchHendrickson would make adjustments to his meds if needed.  Also, he would like a referral to a urological practice near him since he is still having issues with frequency and nocturia.  I informed Dr. Dorris FetchHendrickson of these issues. BP has been running around 140-160 systolic and 90-100 diastolic.  He added Norvasc 10 mg daily to his medications.  He said that I could make a referral to the urological practice.  I informed Mr. Michael Duran.  I told him he needed to see Dr. Antoine PocheHochrein for follow up and he agreed.

## 2014-11-02 ENCOUNTER — Telehealth: Payer: Self-pay | Admitting: Cardiology

## 2014-11-02 DIAGNOSIS — I10 Essential (primary) hypertension: Secondary | ICD-10-CM

## 2014-11-02 MED ORDER — AMLODIPINE BESYLATE 10 MG PO TABS
10.0000 mg | ORAL_TABLET | Freq: Every day | ORAL | Status: DC
Start: 2014-11-02 — End: 2015-07-27

## 2014-11-02 NOTE — Telephone Encounter (Signed)
Spoke to patient  Per Dr Antoine PocheHochrein, continue with current medication Patient states he needs a prescription for AMLOIDIPINE 10 MG called into pharmacy

## 2014-11-02 NOTE — Telephone Encounter (Signed)
Continue current meds.  Call Mr. Michael Duran with the results and send results to Sissy HoffSWAYNE,DAVID W, MD

## 2014-11-02 NOTE — Telephone Encounter (Signed)
Spoke to [facility]. She states patient is there today for orientation. Beverly reviewed medication with patient. He was not taking Metoprolo Succ. 50 mg  but was still taking Metoprolol Succ. 25 mg. Dr Shelly RubensteinHENDERICKSON recently added Norvasc 10 MG due to increase in blood pressure reading. Today's blood pressure at rehab is 114/70 , but patient states it is higher at home. Patient is going out of town until Monday 11/07/14.  RN informed Meriam SpragueBeverly -to tell patient to continue current medications until further information from the office. Meriam SpragueBeverly states she has not received signed order form for rehab as of yet,- RN ask her to refax form RN received form and placed in Dr Lex[employer] InternationalHochrein's box. Routed LaderaHOCHREIN, JC [facility]

## 2014-11-03 ENCOUNTER — Encounter (HOSPITAL_COMMUNITY): Payer: Self-pay | Admitting: Interventional Cardiology

## 2014-11-07 ENCOUNTER — Telehealth: Payer: Self-pay | Admitting: *Deleted

## 2014-11-07 NOTE — Telephone Encounter (Signed)
Signed order for cardiac rehab faxed to [facility].

## 2014-11-08 ENCOUNTER — Encounter: Payer: Self-pay | Admitting: *Deleted

## 2014-11-08 NOTE — Progress Notes (Signed)
Patient ID: Michael Duran, male   DOB: 02/18/1966, 48 y.o.   MRN: [id number] Per c/o frequency and nocturia since his CABG 09/02/14 when a foley catheter was inserted and removed without difficulty or complications, A referral was made today to [facility] in [place].  This was the practice of choice for Michael Duran due to location near where he lived.  Records were faxed to facilitate evaluation and treatment.

## 2014-12-20 ENCOUNTER — Encounter: Payer: Self-pay | Admitting: Cardiology

## 2014-12-20 ENCOUNTER — Ambulatory Visit (INDEPENDENT_AMBULATORY_CARE_PROVIDER_SITE_OTHER): Payer: Managed Care, Other (non HMO) | Admitting: Cardiology

## 2014-12-20 VITALS — BP 126/78 | HR 68 | Ht 70.0 in | Wt 209.7 lb

## 2014-12-20 DIAGNOSIS — I2510 Atherosclerotic heart disease of native coronary artery without angina pectoris: Secondary | ICD-10-CM

## 2014-12-20 DIAGNOSIS — I480 Paroxysmal atrial fibrillation: Secondary | ICD-10-CM

## 2014-12-20 NOTE — Patient Instructions (Signed)
Your physician recommends that you schedule a follow-up appointment in: 6 months with Dr. Hochrein  

## 2014-12-20 NOTE — Progress Notes (Signed)
HPI The patient for followup after CABG. He had some exertional chest pain and had ischemia on stress perfusion imaging. He is status post CABG as described below.   He has done well with this. He did have postoperative atrial fibrillation with amiodarone.   He is now off of this and has felt none of this rhythm.  He denies any chest pressure, neck or arm discomfort. He has some sternal wound discomfort. He's not noticed any further palpitations, presyncope or syncope. He's had no new shortness of breath, PND or orthopnea.  He did cardiac rehab and is now exercising on his own.   No Known Allergies  Current Outpatient Prescriptions  Medication Sig Dispense Refill  . amLODipine (NORVASC) 10 MG tablet Take 1 tablet (10 mg total) by mouth daily. 90 tablet 2  . aspirin 81 MG tablet Take 81 mg by mouth daily.    Marland Kitchen atorvastatin (LIPITOR) 80 MG tablet Take 1 tablet (80 mg total) by mouth daily. 30 tablet 1  . losartan (COZAAR) 100 MG tablet Take 1 tablet by mouth daily.    . metoprolol succinate (TOPROL-XL) 25 MG 24 hr tablet Take 1 tablet by mouth daily.     No current facility-administered medications for this visit.    Past Medical History  Diagnosis Date  . Hyperlipemia     mild  . HTN (hypertension)   . Gout   . Kidney stone     "passed it"  . Coronary artery disease   . Sleep apnea     not an issue with weight loss in 2006  . Diabetes     not since losing weight in 2006  . Chronic lower back pain     Past Surgical History  Procedure Laterality Date  . Lumbar laminectomy  11/2003  . Cardiac catheterization  08/31/2014  . Back surgery    . Tonsillectomy  ~ 1970  . Coronary artery bypass graft N/A 09/02/2014    Procedure: CORONARY ARTERY BYPASS GRAFTING (CABG), ON PUMP, TIMES TWO, USING BILATERAL INTERNAL MAMMARY ARTERIES.;  Surgeon: Loreli Slot, MD;  Location: [facility] OR;  Service: Open Heart Surgery;  Laterality: N/A;  CABG w/ bilateral mammaries  . Tee without  cardioversion N/A 09/02/2014    Procedure: TRANSESOPHAGEAL ECHOCARDIOGRAM (TEE);  Surgeon: Loreli Slot, MD;  Location: [facility] OR;  Service: Open Heart Surgery;  Laterality: N/A;  . Left heart catheterization with coronary angiogram N/A 08/31/2014    Procedure: LEFT HEART CATHETERIZATION WITH CORONARY ANGIOGRAM;  Surgeon: Lesleigh Noe, MD;  Location: [facility] CATH LAB;  Service: Cardiovascular;  Laterality: N/A;    ROS:  As stated in the HPI and negative for all other systems.  PHYSICAL EXAM BP 126/78 mmHg  Pulse 68  Ht  (1.778 m)  Wt 209 lb 11.2 oz (95.119 kg)  BMI 30.09 kg/m2 GENERAL:  Well appearing NECK:  No jugular venous distention, waveform within normal limits, carotid upstroke brisk and symmetric, no bruits, no thyromegaly LUNGS:  Clear to auscultation bilaterally CHEST:  Well healed sternotomy scar. HEART:  PMI not displaced or sustained,S1 and S2 within normal limits, no S3, no S4, no clicks, no rubs,  no murmurs ABD:  Flat, positive bowel sounds normal in frequency in pitch, no bruits, no rebound, no guarding, no midline pulsatile mass, no hepatomegaly, no splenomegaly EXT:  2 plus pulses throughout, no edema, no cyanosis no clubbing   ASSESSMENT AND PLAN   CAD:  The patient is doing well status  post CABG. He will continue with risk reduction.   DYSLIPIDEMIA:  His LD was 78 with an HDL of 42.  The target should be an LDL of 70.  He will remain on the meds as listed but work on his diet.   HTN:  The blood pressure is at target. No change in medications is indicated. We will continue with therapeutic lifestyle changes (TLC).  ATRIAL FIB:  He is off of amiodarone.  No further therapy is indicated.

## 2015-01-26 ENCOUNTER — Telehealth: Payer: Self-pay | Admitting: Cardiology

## 2015-01-26 NOTE — Telephone Encounter (Signed)
New message     What dental office are you calling from? Pt does not know the name of the dentist--this is his first visit and his wife made the appt 1. What is your office phone and fax number?   2. What type of procedure is the patient having performed? clearning  3. What date is procedure scheduled? 01-30-15  4. What is your question (ex. Antibiotics prior to procedure, holding medication-we need to know how long dentist wants pt to hold med)? Pt had bypass surgery in oct----will he need to be premedicated prior to appt.

## 2015-01-26 NOTE — Telephone Encounter (Signed)
RN spoke to D.O.D - Dr. Swazil[place]Jordan   No pre- procedure antibiotic needed.  RN informed patient. patient verbalized understanding.

## 2015-02-16 ENCOUNTER — Telehealth: Payer: Self-pay | Admitting: *Deleted

## 2015-02-16 NOTE — Telephone Encounter (Signed)
Clearance for tooth extraction faxed to the number provided.

## 2015-04-13 ENCOUNTER — Encounter: Payer: Self-pay | Admitting: Cardiology

## 2015-04-13 ENCOUNTER — Ambulatory Visit (INDEPENDENT_AMBULATORY_CARE_PROVIDER_SITE_OTHER): Payer: Managed Care, Other (non HMO) | Admitting: Cardiology

## 2015-04-13 VITALS — BP 110/78 | HR 61 | Ht 70.0 in | Wt 213.0 lb

## 2015-04-13 DIAGNOSIS — I480 Paroxysmal atrial fibrillation: Secondary | ICD-10-CM

## 2015-04-13 DIAGNOSIS — M79602 Pain in left arm: Secondary | ICD-10-CM

## 2015-04-13 DIAGNOSIS — I10 Essential (primary) hypertension: Secondary | ICD-10-CM | POA: Diagnosis not present

## 2015-04-13 DIAGNOSIS — I2510 Atherosclerotic heart disease of native coronary artery without angina pectoris: Secondary | ICD-10-CM

## 2015-04-13 DIAGNOSIS — M79605 Pain in left leg: Secondary | ICD-10-CM

## 2015-04-13 NOTE — Patient Instructions (Addendum)
Your physician recommends that you schedule a follow-up appointment in: 3 months with Dr. Antoine PocheHochrein  Take your BP daily and report if Systolic pressure is consistantly 120 mmhg  And Dr. Antoine PocheHochrein may want to decrease or stop your Amlodipine

## 2015-04-13 NOTE — Progress Notes (Signed)
HPI The patient for followup after CABG. He had some exertional chest pain and had ischemia on stress perfusion imaging. He is status post CABG as described below.   He called to move up his appointment with me  today because he was having some discomfort. He says this is in the center of his sternum and superficial and underwent previous. He's also had some elbow discomfort. He said his arm hurt him after he carries something very heavy and are recently. He now has some discomfort and swinging a golf club.   He can exercise and not bring this on. He's not describing any associated symptoms such as shortness of breath, PND or orthopnea. No nausea vomiting. He's had no weight gain or swelling. He's had no palpitations, presyncope or syncope.own.     No Known Allergies  Current Outpatient Prescriptions  Medication Sig Dispense Refill  . amLODipine (NORVASC) 10 MG tablet Take 1 tablet (10 mg total) by mouth daily. 90 tablet 2  . aspirin 81 MG tablet Take 81 mg by mouth daily.    Marland Kitchen. atorvastatin (LIPITOR) 80 MG tablet Take 1 tablet (80 mg total) by mouth daily. 30 tablet 1  . losartan (COZAAR) 100 MG tablet Take 1 tablet by mouth daily.    . metoprolol succinate (TOPROL-XL) 25 MG 24 hr tablet Take 1 tablet by mouth daily.     No current facility-administered medications for this visit.    Past Medical History  Diagnosis Date  . Hyperlipemia     mild  . HTN (hypertension)   . Gout   . Kidney stone     "passed it"  . Coronary artery disease   . Sleep apnea     not an issue with weight loss in 2006  . Diabetes     not since losing weight in 2006  . Chronic lower back pain     Past Surgical History  Procedure Laterality Date  . Lumbar laminectomy  11/2003  . Cardiac catheterization  08/31/2014  . Back surgery    . Tonsillectomy  ~ 1970  . Coronary artery bypass graft N/A 09/02/2014    Procedure: CORONARY ARTERY BYPASS GRAFTING (CABG), ON PUMP, TIMES TWO, USING BILATERAL INTERNAL  MAMMARY ARTERIES.;  Surgeon: Loreli SlotSteven C Hendrickson, MD;  Location: [facility] OR;  Service: Open Heart Surgery;  Laterality: N/A;  CABG w/ bilateral mammaries  . Tee without cardioversion N/A 09/02/2014    Procedure: TRANSESOPHAGEAL ECHOCARDIOGRAM (TEE);  Surgeon: Loreli SlotSteven C Hendrickson, MD;  [facility];  Service: Open Heart Surgery;  Laterality: N/A;  . Left heart catheterization with coronary angiogram N/A 08/31/2014    Procedure: LEFT HEART CATHETERIZATION WITH CORONARY ANGIOGRAM;  Surgeon: Lesleigh NoeHenry W Smith III, MD;  [facility];  Service: Cardiovascular;  Laterality: N/A;    ROS:  As stated in the HPI and negative for all other systems.  PHYSICAL EXAM BP 110/78 mmHg  Pulse 61  Ht 5\' 10"  (1.778 m)  Wt 213 lb (96.616 kg)  BMI 30.56 kg/m2 GENERAL:  Well appearing NECK:  No jugular venous distention, waveform within normal limits, carotid upstroke brisk and symmetric, no bruits, no thyromegaly LUNGS:  Clear to auscultation bilaterally CHEST:  Well healed sternotomy scar. HEART:  PMI not displaced or sustained,S1 and S2 within normal limits, no S3, no S4, no clicks, no rubs,  no murmurs ABD:  Flat, positive bowel sounds normal in frequency in pitch, no bruits, no rebound, no guarding, no midline pulsatile mass, no hepatomegaly, no  splenomegaly EXT:  2 plus pulses throughout, no edema, no cyanosis no clubbing  EKG:  Sinus rhythm, rate 61, axis within normal limits, intervals within normal limits, no acute ST-T wave changes. 04/13/2015   ASSESSMENT AND PLAN   CAD:  The patient is doing well status post CABG. He will continue with risk reduction.  I think the current pain is clearly musculoskeletal.  DYSLIPIDEMIA:  His LDL was slightly above target previously. Follow this up and see him again.  HTN:  The blood pressure is at target. No change in medications is indicated. We will continue with therapeutic lifestyle changes (TLC).

## 2015-05-10 ENCOUNTER — Ambulatory Visit: Payer: Managed Care, Other (non HMO) | Admitting: Cardiology

## 2015-05-18 ENCOUNTER — Other Ambulatory Visit: Payer: Self-pay | Admitting: Family Medicine

## 2015-05-18 ENCOUNTER — Ambulatory Visit
Admission: RE | Admit: 2015-05-18 | Discharge: 2015-05-18 | Disposition: A | Discharge status: Home / Self Care | Payer: Managed Care, Other (non HMO) | Source: Ambulatory Visit | Attending: Family Medicine | Admitting: Family Medicine

## 2015-05-18 DIAGNOSIS — R1011 Right upper quadrant pain: Secondary | ICD-10-CM

## 2015-05-18 DIAGNOSIS — R109 Unspecified abdominal pain: Secondary | ICD-10-CM

## 2015-07-01 ENCOUNTER — Other Ambulatory Visit: Payer: Self-pay | Admitting: Physician Assistant

## 2015-07-18 ENCOUNTER — Encounter: Payer: Self-pay | Admitting: Cardiology

## 2015-07-18 ENCOUNTER — Ambulatory Visit (INDEPENDENT_AMBULATORY_CARE_PROVIDER_SITE_OTHER): Payer: Managed Care, Other (non HMO) | Admitting: Cardiology

## 2015-07-18 VITALS — BP 128/82 | HR 68 | Ht 70.0 in | Wt 219.3 lb

## 2015-07-18 DIAGNOSIS — I2510 Atherosclerotic heart disease of native coronary artery without angina pectoris: Secondary | ICD-10-CM

## 2015-07-18 DIAGNOSIS — I480 Paroxysmal atrial fibrillation: Secondary | ICD-10-CM

## 2015-07-18 DIAGNOSIS — I10 Essential (primary) hypertension: Secondary | ICD-10-CM

## 2015-07-18 MED ORDER — LOSARTAN POTASSIUM 100 MG PO TABS: 100.0000 mg | ORAL_TABLET | Freq: Every day | ORAL | Status: DC

## 2015-07-18 NOTE — Patient Instructions (Signed)
Your physician wants you to follow-up in: 1 Year. You will receive a reminder letter in the mail two months in advance. If you don't receive a letter, please call our office to schedule the follow-up appointment.  

## 2015-07-18 NOTE — Progress Notes (Signed)
HPI The patient for followup after CABG.  Since I last saw her she has done well.  The patient denies any new symptoms such as chest discomfort, neck or arm discomfort. There has been no new shortness of breath, PND or orthopnea. There have been no reported palpitations, presyncope or syncope.  He has had some limitations secondary to back pain.    No Known Allergies  Current Outpatient Prescriptions  Medication Sig Dispense Refill  . amLODipine (NORVASC) 10 MG tablet Take 1 tablet (10 mg total) by mouth daily. 90 tablet 2  . aspirin 81 MG tablet Take 81 mg by mouth daily.    Marland Kitchen atorvastatin (LIPITOR) 80 MG tablet TAKE ONE TABLET BY MOUTH ONE TIME DAILY 30 tablet 11  . losartan (COZAAR) 100 MG tablet Take 1 tablet by mouth daily.    . metoprolol succinate (TOPROL-XL) 25 MG 24 hr tablet TAKE ONE TABLET BY MOUTH ONE TIME DAILY 30 tablet 11   No current facility-administered medications for this visit.    Past Medical History  Diagnosis Date  . Hyperlipemia     mild  . HTN (hypertension)   . Gout   . Kidney stone     "passed it"  . Coronary artery disease   . Sleep apnea     not an issue with weight loss in 2006  . Diabetes     not since losing weight in 2006  . Chronic lower back pain     Past Surgical History  Procedure Laterality Date  . Lumbar laminectomy  11/2003  . Cardiac catheterization  08/31/2014  . Back surgery    . Tonsillectomy  ~ 1970  . Coronary artery bypass graft N/A 09/02/2014    Procedure: CORONARY ARTERY BYPASS GRAFTING (CABG), ON PUMP, TIMES TWO, USING BILATERAL INTERNAL MAMMARY ARTERIES.;  Surgeon: Loreli Slot, MD;  Location: [facility] OR;  Service: Open Heart Surgery;  Laterality: N/A;  CABG w/ bilateral mammaries  . Tee without cardioversion N/A 09/02/2014    Procedure: TRANSESOPHAGEAL ECHOCARDIOGRAM (TEE);  Surgeon: Loreli Slot, MD;  Location: [facility] OR;  Service: Open Heart Surgery;  Laterality: N/A;  . Left heart catheterization with coronary  angiogram N/A 08/31/2014    Procedure: LEFT HEART CATHETERIZATION WITH CORONARY ANGIOGRAM;  Surgeon: Lesleigh Noe, MD;  Location: [facility] CATH LAB;  Service: Cardiovascular;  Laterality: N/A;    ROS:  As stated in the HPI and negative for all other systems.  PHYSICAL EXAM BP 128/82 mmHg  Pulse 68  Ht  (1.778 m)  Wt 219 lb 5 oz (99.479 kg)  BMI 31.47 kg/m2 GENERAL:  Well appearing NECK:  No jugular venous distention, waveform within normal limits, carotid upstroke brisk and symmetric, no bruits, no thyromegaly LUNGS:  Clear to auscultation bilaterally CHEST:  Well healed sternotomy scar. HEART:  PMI not displaced or sustained,S1 and S2 within normal limits, no S3, no S4, no clicks, no rubs, very soft apical murmurs non radiating. ABD:  Flat, positive bowel sounds normal in frequency in pitch, no bruits, no rebound, no guarding, no midline pulsatile mass, no hepatomegaly, no splenomegaly EXT:  2 plus pulses throughout, no edema, no cyanosis no clubbing   ASSESSMENT AND PLAN   CAD:  The patient is doing well status post CABG. He will continue with risk reduction.  No further testing is indicated.   DYSLIPIDEMIA:  His LDL was 76 with an HDL of 42.  No change in therapy is indicated.   HTN:  The blood pressure is at target. No change in medications is indicated. We will continue with therapeutic lifestyle changes (TLC).  PREOP:  Injection in back planned.  He would be at acceptable risk for this and would be able to hold the ASA as needed if necessary.  No additional heart testing is indidcated.

## 2015-07-27 ENCOUNTER — Telehealth: Payer: Self-pay | Admitting: Cardiology

## 2015-07-27 ENCOUNTER — Encounter: Payer: Self-pay | Admitting: *Deleted

## 2015-07-27 DIAGNOSIS — I10 Essential (primary) hypertension: Secondary | ICD-10-CM

## 2015-07-27 MED ORDER — AMLODIPINE BESYLATE 10 MG PO TABS: 10.0000 mg | ORAL_TABLET | Freq: Every day | ORAL | Status: DC

## 2015-07-27 NOTE — Telephone Encounter (Signed)
Pt aware refill sent to the pharmacy electronically   

## 2015-07-27 NOTE — Telephone Encounter (Signed)
°  1. Which medications need to be refilled? Amlodipine   2. Which pharmacy is medication to be sent to?[facility] in [place] on [address]   3. Do they need a 30 day or 90 day supply? 90  4. Would they like a call back once the medication has been sent to the pharmacy? Yes

## 2015-07-27 NOTE — Telephone Encounter (Signed)
This encounter was created in error - please disregard.

## 2015-10-05 ENCOUNTER — Other Ambulatory Visit: Payer: Self-pay | Admitting: Cardiology

## 2015-10-05 MED ORDER — ATORVASTATIN CALCIUM 80 MG PO TABS: 80.0000 mg | ORAL_TABLET | Freq: Every day | ORAL | Status: DC

## 2015-10-06 ENCOUNTER — Other Ambulatory Visit: Payer: Self-pay

## 2015-10-06 MED ORDER — METOPROLOL SUCCINATE ER 25 MG PO TB24: 25.0000 mg | ORAL_TABLET | Freq: Every day | ORAL | Status: DC

## 2015-11-14 ENCOUNTER — Emergency Department
Admission: EM | Admit: 2015-11-14 | Discharge: 2015-11-14 | Disposition: A | Discharge status: Home / Self Care | Payer: Managed Care, Other (non HMO) | Source: Home / Self Care

## 2015-11-14 ENCOUNTER — Encounter: Payer: Self-pay | Admitting: *Deleted

## 2015-11-14 DIAGNOSIS — M10061 Idiopathic gout, right knee: Secondary | ICD-10-CM | POA: Diagnosis not present

## 2015-11-14 LAB — POCT CBC W AUTO DIFF (K'VILLE URGENT CARE)

## 2015-11-14 MED ORDER — METHYLPREDNISOLONE SODIUM SUCC 125 MG IJ SOLR
125.0000 mg | Freq: Once | INTRAMUSCULAR | Status: AC
  Administered 2015-11-14: 125 mg via INTRAMUSCULAR

## 2015-11-14 MED ORDER — PREDNISONE 50 MG PO TABS: ORAL_TABLET | ORAL | Status: DC

## 2015-11-14 NOTE — ED Provider Notes (Signed)
CSN: [id number]     Arrival date & time 11/14/15  1814 History   None    Chief Complaint  Patient presents with  . Knee Pain      HPI Comments: Patient has a history of gout.  He developed a recurrent episode of pain, swelling, and redness in his right knee yesterday.  He had a low grade fever of 100 this afternoon.  He has taken colchicine today without improvement.  Patient is a 49 y.o. male presenting with knee pain. The history is provided by the patient.  Knee Pain Location:  Knee Time since incident:  1 day Injury: no   Knee location:  R knee Pain details:    Quality:  Aching   Radiates to:  Does not radiate   Severity:  Moderate   Onset quality:  Sudden   Duration:  1 day   Timing:  Constant   Progression:  Unchanged Chronicity:  Recurrent Prior injury to area:  No Relieved by:  Nothing Worsened by:  Bearing weight, flexion and extension Ineffective treatments: colchicine. Associated symptoms: decreased ROM, fever, stiffness and swelling   Associated symptoms: no muscle weakness and no tingling     Past Medical History  Diagnosis Date  . Hyperlipemia     mild  . HTN (hypertension)   . Gout   . Kidney stone     "passed it"  . Coronary artery disease   . Sleep apnea     not an issue with weight loss in 2006  . Diabetes (HCC)     not since losing weight in 2006  . Chronic lower back pain    Past Surgical History  Procedure Laterality Date  . Lumbar laminectomy  11/2003  . Cardiac catheterization  08/31/2014  . Back surgery    . Tonsillectomy  ~ 1970  . Coronary artery bypass graft N/A 09/02/2014    Procedure: CORONARY ARTERY BYPASS GRAFTING (CABG), ON PUMP, TIMES TWO, USING BILATERAL INTERNAL MAMMARY ARTERIES.;  Surgeon: Loreli Slot, MD;  Location: [facility] OR;  Service: Open Heart Surgery;  Laterality: N/A;  CABG w/ bilateral mammaries  . Tee without cardioversion N/A 09/02/2014    Procedure: TRANSESOPHAGEAL ECHOCARDIOGRAM (TEE);  Surgeon: Loreli Slot, MD;  Location: [facility] OR;  Service: Open Heart Surgery;  Laterality: N/A;  . Left heart catheterization with coronary angiogram N/A 08/31/2014    Procedure: LEFT HEART CATHETERIZATION WITH CORONARY ANGIOGRAM;  Surgeon: Lesleigh Noe, MD;  Location: [facility] CATH LAB;  Service: Cardiovascular;  Laterality: N/A;   Family History  Problem Relation Age of Onset  . CAD Father 98    AVR  . CAD Mother 34    CABG  . Diabetes Brother   . Heart attack Father   . Heart failure Mother   . Stroke Neg Hx    Social History  Substance Use Topics  . Smoking status: Never Smoker   . Smokeless tobacco: Never Used  . Alcohol Use: No    Review of Systems  Constitutional: Positive for fever.  Musculoskeletal: Positive for stiffness.  All other systems reviewed and are negative.   Allergies  Review of patient's allergies indicates no known allergies.  Home Medications   Prior to Admission medications   Medication Sig Start Date End Date Taking? Authorizing Provider  amLODipine (NORVASC) 10 MG tablet Take 1 tablet (10 mg total) by mouth daily. 07/27/15  Yes Rollene Rotunda, MD  aspirin 81 MG tablet Take 81 mg by mouth daily.  Yes Historical Provider, MD  atorvastatin (LIPITOR) 80 MG tablet Take 1 tablet (80 mg total) by mouth daily. 10/05/15  Yes Rollene RotundaJames Hochrein, MD  colchicine 0.6 MG tablet Take 0.6 mg by mouth daily.   Yes Historical Provider, MD  Fish Oil-Cholecalciferol (FISH OIL + D3 PO) Take by mouth.   Yes Historical Provider, MD  losartan (COZAAR) 100 MG tablet Take 1 tablet (100 mg total) by mouth daily. 07/18/15  Yes Rollene RotundaJames Hochrein, MD  metoprolol succinate (TOPROL-XL) 25 MG 24 hr tablet Take 1 tablet (25 mg total) by mouth daily. 10/06/15  Yes Rollene RotundaJames Hochrein, MD  predniSONE (DELTASONE) 50 MG tablet Take one tab by mouth with food once daily for five days.  Begin taking 11/15/15 11/14/15   Lattie HawStephen A Arshia Rondon, MD   Meds Ordered and Administered this Visit   Medications   methylPREDNISolone sodium succinate (SOLU-MEDROL) 125 mg/2 mL injection 125 mg (not administered)    BP 132/85 mmHg  Pulse 87  Temp(Src) 98.1 F (36.7 C) (Oral)  Resp 18  Ht 5\' 10"  (1.778 m)  Wt 218 lb (98.884 kg)  BMI 31.28 kg/m2  SpO2 97% No data found.   Physical Exam  Constitutional: He is oriented to person, place, and time. He appears well-developed and well-nourished. No distress.  HENT:  Head: Normocephalic.  Eyes: Pupils are equal, round, and reactive to light.  Neck: Neck supple.  Cardiovascular: Normal heart sounds.   Pulmonary/Chest: Breath sounds normal.  Abdominal: There is no tenderness.  Musculoskeletal: He exhibits no edema.       Right knee: He exhibits decreased range of motion and swelling. He exhibits no erythema. Tenderness found. Medial joint line and lateral joint line tenderness noted.  Right knee has mild swelling.  Distal neurovascular function is intact.   Neurological: He is alert and oriented to person, place, and time.  Skin: Skin is warm and dry.  Nursing note and vitals reviewed.   ED Course  Procedures  None    Labs Reviewed  POCT CBC W AUTO DIFF ([facility]):  WBC 13.0; LY 16.1; MO 8.1; GR 75.8; Hgb 14.3; Platelets 277       MDM   1. Acute idiopathic gout of right knee    Solumedrol 125mg  IM administered.  Begin prednisone burst tomorrow. Increase fluid intake.  May take Tylenol as needed for pain. Followup with Family Doctor if not improved in two days.    Lattie HawStephen A Alyas Creary, MD 11/22/15 1016

## 2015-11-14 NOTE — ED Notes (Signed)
Pt c/o RT knee pain and redness x 1 day.  Reports temp of 100 this afternoon. He took tylenol 975mg  at 1745. Hx of Gout.

## 2015-11-14 NOTE — Discharge Instructions (Signed)
Increase fluid intake.  May take Tylenol as needed for pain. 

## 2015-12-11 ENCOUNTER — Emergency Department
Admission: EM | Admit: 2015-12-11 | Discharge: 2015-12-11 | Discharge status: Absent without leave | Payer: Self-pay | Source: Home / Self Care

## 2015-12-11 ENCOUNTER — Emergency Department
Admission: EM | Admit: 2015-12-11 | Discharge: 2015-12-11 | Disposition: A | Discharge status: Home / Self Care | Payer: Managed Care, Other (non HMO) | Source: Home / Self Care | Attending: Emergency Medicine | Admitting: Emergency Medicine

## 2015-12-11 ENCOUNTER — Encounter: Payer: Self-pay | Admitting: Emergency Medicine

## 2015-12-11 DIAGNOSIS — J101 Influenza due to other identified influenza virus with other respiratory manifestations: Secondary | ICD-10-CM

## 2015-12-11 DIAGNOSIS — J111 Influenza due to unidentified influenza virus with other respiratory manifestations: Secondary | ICD-10-CM

## 2015-12-11 LAB — POCT INFLUENZA A/B
Influenza A, POC: POSITIVE — AB
Influenza B, POC: NEGATIVE

## 2015-12-11 MED ORDER — BENZONATATE 200 MG PO CAPS: ORAL_CAPSULE | ORAL | Status: DC

## 2015-12-11 MED ORDER — OSELTAMIVIR PHOSPHATE 75 MG PO CAPS: ORAL_CAPSULE | ORAL | Status: DC

## 2015-12-11 NOTE — ED Notes (Signed)
Patient reports onset of fever, congestion, aches, cough 2 days ago; has been alternating tylenol and ibuprofen products every 4 hours.

## 2015-12-11 NOTE — ED Provider Notes (Signed)
CSN: [id number]     Arrival date & time 12/11/15  1059 History   First MD Initiated Contact with Patient 12/11/15 1105     Chief Complaint  Patient presents with  . Fever  . Nasal Congestion  . Generalized Body Aches   Here with wife HPI  Flu symptoms for 2 days. Fever to 102 with chills, sweats, myalgias, fatigue, headache. Symptoms are progressively worsening, despite trying OTC fever reducing medicine and rest and fluids. Has decreased appetite, but tolerating some liquids by mouth. No history of recent tick bite.  Review of Systems: Positive for fatigue, mild nasal congestion, mild sore throat, mild swollen anterior neck glands, mild cough. Negative for acute vision changes, stiff neck, focal weakness, syncope, seizures, respiratory distress, vomiting, diarrhea, GU symptoms, new rash.  Past Medical History  Diagnosis Date  . Hyperlipemia     mild  . HTN (hypertension)   . Gout   . Kidney stone     "passed it"  . Coronary artery disease   . Sleep apnea     not an issue with weight loss in 2006  . Diabetes (HCC)     not since losing weight in 2006  . Chronic lower back pain    Past Surgical History  Procedure Laterality Date  . Lumbar laminectomy  11/2003  . Cardiac catheterization  08/31/2014  . Back surgery    . Tonsillectomy  ~ 1970  . Coronary artery bypass graft N/A 09/02/2014    Procedure: CORONARY ARTERY BYPASS GRAFTING (CABG), ON PUMP, TIMES TWO, USING BILATERAL INTERNAL MAMMARY ARTERIES.;  Surgeon: Loreli SlotSteven C Hendrickson, MD;  Location: [facility] OR;  Service: Open Heart Surgery;  Laterality: N/A;  CABG w/ bilateral mammaries  . Tee without cardioversion N/A 09/02/2014    Procedure: TRANSESOPHAGEAL ECHOCARDIOGRAM (TEE);  Surgeon: Loreli SlotSteven C Hendrickson, MD;  [facility];  Service: Open Heart Surgery;  Laterality: N/A;  . Left heart catheterization with coronary angiogram N/A 08/31/2014    Procedure: LEFT HEART CATHETERIZATION WITH CORONARY ANGIOGRAM;  Surgeon: Lesleigh NoeHenry W Smith  III, MD;  [facility];  Service: Cardiovascular;  Laterality: N/A;   Family History  Problem Relation Age of Onset  . CAD Father 7460    AVR  . CAD Mother 5469    CABG  . Diabetes Brother   . Heart attack Father   . Heart failure Mother   . Stroke Neg Hx    Social History  Substance Use Topics  . Smoking status: Never Smoker   . Smokeless tobacco: Never Used  . Alcohol Use: No    Review of Systems  All other systems reviewed and are negative.   Allergies  Review of patient's allergies indicates no known allergies.  Home Medications   Prior to Admission medications   Medication Sig Start Date End Date Taking? Authorizing Provider  amLODipine (NORVASC) 10 MG tablet Take 1 tablet (10 mg total) by mouth daily. 07/27/15   Rollene RotundaJames Hochrein, MD  aspirin 81 MG tablet Take 81 mg by mouth daily.    Historical Provider, MD  atorvastatin (LIPITOR) 80 MG tablet Take 1 tablet (80 mg total) by mouth daily. 10/05/15   Rollene RotundaJames Hochrein, MD  benzonatate (TESSALON) 200 MG capsule Take 1 every 8 hours as needed for cough. 12/11/15   Lajean Manesavid Massey, MD  colchicine 0.6 MG tablet Take 0.6 mg by mouth daily.    Historical Provider, MD  Fish Oil-Cholecalciferol (FISH OIL + D3 PO) Take by mouth.    Historical  Provider, MD  losartan (COZAAR) 100 MG tablet Take 1 tablet (100 mg total) by mouth daily. 07/18/15   Rollene Rotunda, MD  metoprolol succinate (TOPROL-XL) 25 MG 24 hr tablet Take 1 tablet (25 mg total) by mouth daily. 10/06/15   Rollene Rotunda, MD  oseltamivir (TAMIFLU) 75 MG capsule Starting today, take 1 capsule by mouth twice a day for 5 days. 12/11/15   Lajean Manes, MD   Meds Ordered and Administered this Visit  Medications - No data to display  BP 107/68 mmHg  Pulse 90  Temp(Src) 98.7 F (37.1 C) (Oral)  Resp 16  SpO2 98% No data found.   Physical Exam  Constitutional: He appears well-developed and well-nourished.  Non-toxic appearance. He appears ill (very fatigued, but no  cardiorespiratory distress). No distress.  HENT:  Head: Atraumatic.  Right Ear: Tympanic membrane and external ear normal.  Left Ear: Tympanic membrane and external ear normal.  Nose: Rhinorrhea present.  Mouth/Throat: Mucous membranes are normal. Posterior oropharyngeal erythema (Minimal injection, no definite redness. No swelling or exudate) present. No oropharyngeal exudate.  Eyes: Conjunctivae are normal. Right eye exhibits no discharge. Left eye exhibits no discharge. No scleral icterus.  Neck: Neck supple.  Cardiovascular: Normal rate, regular rhythm and normal heart sounds.   No murmur heard. Pulmonary/Chest: Breath sounds normal. No stridor. No respiratory distress. He has no wheezes. He has no rales. He exhibits no tenderness.  Vertical sternotomy surgical scar intact, nontender  Abdominal: Soft. He exhibits no distension and no mass. There is no tenderness. There is no rebound and no guarding.  Musculoskeletal: He exhibits no edema.  Lymphadenopathy:    He has cervical adenopathy (mild shoddy anterior cervical nodes).  Neurological: He is alert. No cranial nerve deficit.  Skin: Skin is warm and intact. No rash noted. He is diaphoretic.  Psychiatric: He has a normal mood and affect.  Nursing note and vitals reviewed.  he is able to ambulate on his own.  ED Course  Procedures (including critical care time)  Labs Review Labs Reviewed  POCT INFLUENZA A/B - Abnormal; Notable for the following:    Influenza A, POC Positive (*)    All other components within normal limits   Imaging Review No results found.  MDM   1. Influenza A with respiratory manifestations    No clinical evidence of bacterial infection based on history and physical exam. Risks, benefits, and alternatives of treatment options discussed.  They declined any further testing at this time. His lungs are clear to auscultation, O2 saturation normal 98%. No clinical evidence of pneumonia. Discharge Medication  List as of 12/11/2015 12:10 PM    START taking these medications   Details  benzonatate (TESSALON) 200 MG capsule Take 1 every 8 hours as needed for cough., Normal    oseltamivir (TAMIFLU) 75 MG capsule Starting today, take 1 capsule by mouth twice a day for 5 days., Normal       Other symptomatic care discussed. See detailed instructions in AVS, which were given to patient/and his wife Verbal instructions also given.  Questions invited and answered.  They voiced understanding and agreement with plans.    Lajean Manes, MD 12/11/15 1640

## 2015-12-11 NOTE — Discharge Instructions (Signed)
Influenza, Adult Influenza ("the flu") is a viral infection of the respiratory tract. It occurs more often in winter months because people spend more time in close contact with one another. Influenza can make you feel very sick. Influenza easily spreads from person to person (contagious). CAUSES  Influenza is caused by a virus that infects the respiratory tract. You can catch the virus by breathing in droplets from an infected person's cough or sneeze. You can also catch the virus by touching something that was recently contaminated with the virus and then touching your mouth, nose, or eyes. RISKS AND COMPLICATIONS You may be at risk for a more severe case of influenza if you smoke cigarettes, have diabetes, have chronic heart disease (such as heart failure) or lung disease (such as asthma), or if you have a weakened immune system. Elderly people and pregnant women are also at risk for more serious infections. The most common problem of influenza is a lung infection (pneumonia). Sometimes, this problem can require emergency medical care and may be life threatening.  On physical exam today, your lungs are clear and your oxygen level is normal. Clinically, there is no sign of pneumonia on exam today. SIGNS AND SYMPTOMS  Symptoms typically last 4 to 10 days and may include:  Fever.  Chills.  Headache, body aches, and muscle aches.  Sore throat.  Chest discomfort and cough.  Poor appetite.  Weakness or feeling tired.  Dizziness.  Nausea or vomiting. Or diarrhea DIAGNOSIS  Diagnosis of influenza is often made based on your history and a physical exam. A nose or throat swab test can be done to confirm the diagnosis.--Test is Positive for influenza A today TREATMENT  In mild cases, influenza goes away on its own. Treatment is directed at relieving symptoms. For more severe cases, your health care provider may prescribe antiviral medicines to shorten the sickness. Tamiflu prescription has  been sent to your pharmacy. Also, prescription for cough medicine sent to pharmacy  Antibiotic medicines are not effective because the infection is caused by a virus, not by bacteria. HOME CARE INSTRUCTIONS  Take medicines only as directed by your health care provider.--Alternate Tylenol and ibuprofen every 3 hours if needed for pain or fever.  Use a cool mist humidifier to make breathing easier.  Get plenty of rest until your temperature returns to normal. This usually takes 3 to 4 days.  Drink enough fluid to keep your urine clear or pale yellow.  Cover yourmouth and nosewhen coughing or sneezing,and wash your handswellto prevent thevirusfrom spreading.  Stay homefromwork orschool untilthe fever is gonefor at least 191full day. PREVENTION  An annual influenza vaccination (flu shot) is the best way to avoid getting influenza. An annual flu shot is now routinely recommended for all adults in the [place] SEEK MEDICAL CARE IF:  You experiencechest pain, yourcough worsens,or you producemore mucus.  Youhave nausea,vomiting, ordiarrhea.  Your fever returns or gets worse. SEEK IMMEDIATE MEDICAL CARE IF:  You havetrouble breathing, you become short of breath,or your skin ornails becomebluish.  You have severe painor stiffnessin the neck.  You develop a sudden headache, or pain in the face or ear.  You have nausea or vomiting that you cannot control. MAKE SURE YOU:   Understand these instructions.  Will watch your condition.  Will get help right away if you are not doing well or get worse.   This information is not intended to replace advice given to you by your health care provider. Make sure you discuss  any questions you have with your health care provider.   Document Released: 11/08/2000 Document Revised: 12/02/2014 Document Reviewed: 02/10/2012 Elsevier Interactive Patient Education [employer].

## 2015-12-29 ENCOUNTER — Encounter: Payer: Self-pay | Admitting: Emergency Medicine

## 2015-12-29 ENCOUNTER — Emergency Department (INDEPENDENT_AMBULATORY_CARE_PROVIDER_SITE_OTHER): Payer: Managed Care, Other (non HMO)

## 2015-12-29 ENCOUNTER — Emergency Department
Admission: EM | Admit: 2015-12-29 | Discharge: 2015-12-29 | Disposition: A | Discharge status: Home / Self Care | Payer: Managed Care, Other (non HMO) | Source: Home / Self Care | Attending: Family Medicine | Admitting: Family Medicine

## 2015-12-29 DIAGNOSIS — B9789 Other viral agents as the cause of diseases classified elsewhere: Principal | ICD-10-CM

## 2015-12-29 DIAGNOSIS — J069 Acute upper respiratory infection, unspecified: Secondary | ICD-10-CM

## 2015-12-29 DIAGNOSIS — R05 Cough: Secondary | ICD-10-CM | POA: Diagnosis not present

## 2015-12-29 MED ORDER — BENZONATATE 200 MG PO CAPS: 200.0000 mg | ORAL_CAPSULE | Freq: Every day | ORAL | Status: DC

## 2015-12-29 MED ORDER — AZITHROMYCIN 250 MG PO TABS: ORAL_TABLET | ORAL | Status: DC

## 2015-12-29 NOTE — ED Provider Notes (Signed)
CSN: [id number]     Arrival date & time 12/29/15  0908 History   First MD Initiated Contact with Patient 12/29/15 [phone]     Chief Complaint  Patient presents with  . Cough      HPI Comments: Patient reports that he recovered from the flu about 3 weeks ago uneventfully.  Last week he was in [place] at a meeting.  Two days ago he developed typical cold-like symptoms including mild sore throat, sinus congestion, fatigue, and cough.  He has now developed bilateral posterior back discomfort with cough.  His cough is non-productive and worse at night.  He is concerned that he may have pneumonia.  The history is provided by the patient.    Past Medical History  Diagnosis Date  . Hyperlipemia     mild  . HTN (hypertension)   . Gout   . Kidney stone     "passed it"  . Coronary artery disease   . Sleep apnea     not an issue with weight loss in 2006  . Diabetes (HCC)     not since losing weight in 2006  . Chronic lower back pain    Past Surgical History  Procedure Laterality Date  . Lumbar laminectomy  11/2003  . Cardiac catheterization  08/31/2014  . Back surgery    . Tonsillectomy  ~ 1970  . Coronary artery bypass graft N/A 09/02/2014    Procedure: CORONARY ARTERY BYPASS GRAFTING (CABG), ON PUMP, TIMES TWO, USING BILATERAL INTERNAL MAMMARY ARTERIES.;  Surgeon: Loreli Slot, MD;  Location: [facility] OR;  Service: Open Heart Surgery;  Laterality: N/A;  CABG w/ bilateral mammaries  . Tee without cardioversion N/A 09/02/2014    Procedure: TRANSESOPHAGEAL ECHOCARDIOGRAM (TEE);  Surgeon: Loreli Slot, MD;  Location: [facility] OR;  Service: Open Heart Surgery;  Laterality: N/A;  . Left heart catheterization with coronary angiogram N/A 08/31/2014    Procedure: LEFT HEART CATHETERIZATION WITH CORONARY ANGIOGRAM;  Surgeon: Lesleigh Noe, MD;  Location: [facility] CATH LAB;  Service: Cardiovascular;  Laterality: N/A;   Family History  Problem Relation Age of Onset  . CAD Father 44    AVR  . CAD  Mother 23    CABG  . Diabetes Brother   . Heart attack Father   . Heart failure Mother   . Stroke Neg Hx    Social History  Substance Use Topics  . Smoking status: Never Smoker   . Smokeless tobacco: Never Used  . Alcohol Use: No    Review of Systems + sore throat, resolved + cough ? pleuritic pain posteriorly No wheezing + nasal congestion + post-nasal drainage No sinus pain/pressure No itchy/red eyes No earache No hemoptysis No SOB No fever, + chills No nausea No vomiting No abdominal pain No diarrhea No urinary symptoms No skin rash + fatigue + myalgias No headache Used OTC meds without relief  Allergies  Review of patient's allergies indicates no known allergies.  Home Medications   Prior to Admission medications   Medication Sig Start Date End Date Taking? Authorizing Provider  amLODipine (NORVASC) 10 MG tablet Take 1 tablet (10 mg total) by mouth daily. 07/27/15   Rollene Rotunda, MD  aspirin 81 MG tablet Take 81 mg by mouth daily.    Historical Provider, MD  atorvastatin (LIPITOR) 80 MG tablet Take 1 tablet (80 mg total) by mouth daily. 10/05/15   Rollene Rotunda, MD  azithromycin (ZITHROMAX Z-PAK) 250 MG tablet Take 2 tabs today; then  begin one tab once daily for 4 more days. (Rx void after 01/06/16) 12/29/15   Lattie Haw, MD  benzonatate (TESSALON) 200 MG capsule Take 1 capsule (200 mg total) by mouth at bedtime. Take as needed for cough 12/29/15   Lattie Haw, MD  colchicine 0.6 MG tablet Take 0.6 mg by mouth daily.    Historical Provider, MD  Fish Oil-Cholecalciferol (FISH OIL + D3 PO) Take by mouth.    Historical Provider, MD  losartan (COZAAR) 100 MG tablet Take 1 tablet (100 mg total) by mouth daily. 07/18/15   Rollene Rotunda, MD  metoprolol succinate (TOPROL-XL) 25 MG 24 hr tablet Take 1 tablet (25 mg total) by mouth daily. 10/06/15   Rollene Rotunda, MD   Meds Ordered and Administered this Visit  Medications - No data to display  BP 132/86 mmHg   Pulse 76  Temp(Src) 98.2 F (36.8 C) (Oral)  Ht  (1.778 m)  Wt 218 lb (98.884 kg)  BMI 31.28 kg/m2  SpO2 97% No data found.   Physical Exam Nursing notes and Vital Signs reviewed. Appearance:  Patient appears stated age, and in no acute distress Eyes:  Pupils are equal, round, and reactive to light and accomodation.  Extraocular movement is intact.  Conjunctivae are not inflamed  Ears:  Canals normal.  Tympanic membranes normal.  Nose:  Congested turbinates.  No sinus tenderness.    Pharynx:  Normal Neck:  Supple.  Tender enlarged posterior nodes are palpated bilaterally  Lungs:  Clear to auscultation.  Breath sounds are equal.  Moving air well. Heart:  Regular rate and rhythm without murmurs, rubs, or gallops.  Abdomen:  Nontender without masses or hepatosplenomegaly.  Bowel sounds are present.  No CVA or flank tenderness.  Extremities:  No edema.  Skin:  No rash present.   ED Course  Procedures none  Imaging Review Dg Chest 2 View  12/29/2015  CLINICAL DATA:  Recent influence day, cough for 2 days EXAM: CHEST  2 VIEW COMPARISON:  10/11/2014 FINDINGS: Cardiac shadow is stable. Postsurgical changes are again seen. No focal infiltrate or sizable effusion is noted. No bony abnormality is seen. IMPRESSION: No active cardiopulmonary disease. Electronically Signed   By: Alcide Clever M.D.   On: 12/29/2015 10:19      MDM   1. Viral URI with cough    There is no evidence of bacterial infection today.  Treat symptomatically for now  Prescription written for Benzonatate (Tessalon) to take at bedtime for night-time cough.  Take plain guaifenesin (  extended release tabs such as Mucinex) twice daily, with plenty of water, for cough and congestion.   Get adequate rest.   May use Afrin nasal spray (or generic oxymetazoline) twice daily for about 5 days and then discontinue.  Also recommend using saline nasal spray several times daily and saline nasal irrigation (AYR is a common  brand).  Use Flonase nasal spray each morning after using Afrin nasal spray and saline nasal irrigation. Try warm salt water gargles for sore throat.  Stop all antihistamines for now, and other non-prescription cough/cold preparations. Begin Azithromycin if not improving about one week or if persistent fever develops (Given a prescription to hold, with an expiration date)  Follow-up with family doctor if not improving about10 days.     Lattie Haw, MD 12/29/15 1046

## 2015-12-29 NOTE — ED Notes (Signed)
Deep congested cough x 2 days, chills, upper back pain. Patient had by-pass surgery in 2015 and is concerned about pneumonia, would like a chest x-ray.

## 2015-12-29 NOTE — Discharge Instructions (Signed)
Take plain guaifenesin (1200mg extended release tabs such as Mucinex) twice daily, with plenty of water, for cough and congestion. Get adequate rest.   °May use Afrin nasal spray (or generic oxymetazoline) twice daily for about 5 days and then discontinue.  Also recommend using saline nasal spray several times daily and saline nasal irrigation (AYR is a common brand).  Use Flonase nasal spray each morning after using Afrin nasal spray and saline nasal irrigation. °Try warm salt water gargles for sore throat.  °Stop all antihistamines for now, and other non-prescription cough/cold preparations. °Begin Azithromycin if not improving about one week or if persistent fever develops (Given a prescription to hold, with an expiration date)  °Follow-up with family doctor if not improving about10 days.  °

## 2016-02-22 ENCOUNTER — Ambulatory Visit (INDEPENDENT_AMBULATORY_CARE_PROVIDER_SITE_OTHER): Payer: Managed Care, Other (non HMO) | Admitting: Physical Therapy

## 2016-02-22 DIAGNOSIS — M545 Low back pain, unspecified: Secondary | ICD-10-CM

## 2016-02-22 DIAGNOSIS — M25661 Stiffness of right knee, not elsewhere classified: Secondary | ICD-10-CM | POA: Diagnosis not present

## 2016-02-22 DIAGNOSIS — M25561 Pain in right knee: Secondary | ICD-10-CM | POA: Diagnosis not present

## 2016-02-22 DIAGNOSIS — M6281 Muscle weakness (generalized): Secondary | ICD-10-CM | POA: Diagnosis not present

## 2016-02-22 NOTE — Patient Instructions (Signed)
Lumbar Rotation (Non-Weight Bearing)    Feet on floor, slowly rock knees to one side. Allow lower back to rotate slightly. Hold 30 sec. Repeat __1__ times per set. Do __1__ sets per session. Do __1-2__ sessions per day. Repeat to the other side.   Quads / HF, Prone    Lie face down, knees together. Grasp one ankle with same-side hand. Use towel if needed to reach. Gently pull foot toward buttock. Hold _30__ seconds. Repeat __1_ times per session. Do _1__ sessions per day.   Bridging    Slowly raise buttocks from floor, keeping stomach tight. Repeat __10__ times per set. Do __1__ sets per session. Do __1__ sessions per day.  Bridging: with Straight Leg Raise    With legs bent, lift buttocks __8-10__ inches from floor. Then slowly extend right knee, repeat with the left knee,  keeping stomach tight. Repeat __10__ times per set. Do __1-2__ sets per session. Do _1___ sessions per day.  Copyright  VHI. All rights reserved.   TENS UNIT: This is helpful for muscle pain and spasm.   Search and Purchase a TENS 7000 2nd edition at www.tenspros.com. It should be less than $30.     TENS unit instructions: Do not shower or bathe with the unit on Turn the unit off before removing electrodes or batteries If the electrodes lose stickiness add a drop of water to the electrodes after they are disconnected from the unit and place on plastic sheet. If you continued to have difficulty, call the TENS unit company to purchase more electrodes. Do not apply lotion on the skin area prior to use. Make sure the skin is clean and dry as this will help prolong the life of the electrodes. After use, always check skin for unusual red areas, rash or other skin difficulties. If there are any skin problems, does not apply electrodes to the same area. Never remove the electrodes from the unit by pulling the wires. Do not use the TENS unit or electrodes other than as directed. Do not change electrode  placement without consultating your therapist or physician. Keep 2 fingers with between each electrode. Wear time ratio is 2:1, on to off times.    For example on for 30 minutes off for 15 minutes and then on for 30 minutes off for 15 minutes

## 2016-02-22 NOTE — Therapy (Addendum)
[facility] [place] [address] [place], [place], [address] Phone: [phone]   Fax:  [phone]  Physical Therapy Evaluation  Patient Details  Name: Michael Duran MRN: [id number] Date of Birth: [birth year] Referring Provider: Dr Azucena Cecil  Encounter Date: 02/22/2016      PT End of Session - 02/22/16 1539    Visit Number 1   Number of Visits 8   Date for PT Re-Evaluation 03/21/16   PT Start Time 1442   PT Stop Time 1531   PT Time Calculation (min) 49 min   Activity Tolerance Patient limited by pain  in Rt knee with certain activities      Past Medical History  Diagnosis Date  . Hyperlipemia     mild  . HTN (hypertension)   . Gout   . Kidney stone     "passed it"  . Coronary artery disease   . Sleep apnea     not an issue with weight loss in 2006  . Diabetes (HCC)     not since losing weight in 2006  . Chronic lower back pain     Past Surgical History  Procedure Laterality Date  . Lumbar laminectomy  11/2003  . Cardiac catheterization  08/31/2014  . Back surgery    . Tonsillectomy  ~ 1970  . Coronary artery bypass graft N/A 09/02/2014    Procedure: CORONARY ARTERY BYPASS GRAFTING (CABG), ON PUMP, TIMES TWO, USING BILATERAL INTERNAL MAMMARY ARTERIES.;  Surgeon: Loreli Slot, MD;  Location: [facility] OR;  Service: Open Heart Surgery;  Laterality: N/A;  CABG w/ bilateral mammaries  . Tee without cardioversion N/A 09/02/2014    Procedure: TRANSESOPHAGEAL ECHOCARDIOGRAM (TEE);  Surgeon: Loreli Slot, MD;  Location: [facility] OR;  Service: Open Heart Surgery;  Laterality: N/A;  . Left heart catheterization with coronary angiogram N/A 08/31/2014    Procedure: LEFT HEART CATHETERIZATION WITH CORONARY ANGIOGRAM;  Surgeon: Lesleigh Noe, MD;  Location: [facility] CATH LAB;  Service: Cardiovascular;  Laterality: N/A;    There were no vitals filed for this visit.  Visit Diagnosis:  Pain in right knee - Plan: PT plan of care  cert/re-cert  Left-sided low back pain without sciatica - Plan: PT plan of care cert/re-cert  Muscle weakness (generalized) - Plan: PT plan of care cert/re-cert  Stiffness of right knee, not elsewhere classified - Plan: PT plan of care cert/re-cert      Subjective Assessment - 02/22/16 1446    Subjective Pt reports he has had osgood schloaters as a child and has incresaed bony deposits from this, has gout that sometimes flares up in his knees/toes, The epidoes of this has increased ove rthe last 4 months. This has resolved recently.  Abotu 3wks ago got up in the middle of the night and felt a catch in his Rt knee, was able to still do things, except for ascending stairs he would have pain.  Using ibuprofen as needed.    Pertinent History used to walk on the treadmill every day however had back pain develop in the fall and he has stopped.  Had L5 surgery 2005 and now has degenerative changes. Lost > 75# over last many years. 2VCABG 2015   How long can you walk comfortably? pushes through the pain to walk however has pain within minutes.    Diagnostic tests MRI arthritis low back.    Patient Stated Goals return to walking program,    Currently in Pain? No/denies  none at  rest now. LBP ~ 4pm everyday in the Lt SIJ, having injections now.             [facility] PT Assessment - 02/22/16 0001    Assessment   Medical Diagnosis Rt knee pain   Referring Provider Dr Azucena Cecil   Onset Date/Surgical Date 02/01/16   Hand Dominance Right   Next MD Visit not scheduled   Prior Therapy not for this   Precautions   Precautions None   Balance Screen   Has the patient fallen in the past 6 months No   Has the patient had a decrease in activity level because of a fear of falling?  No   Is the patient reluctant to leave their home because of a fear of falling?  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   Leisure golf, walk    Observation/Other Assessments   Focus on Therapeutic Outcomes (FOTO)  52% limited   Functional Tests   Functional tests Squat;Single leg stance   Squat   Comments WNL   Single Leg Stance   Comments WNL   Posture/Postural Control   Posture/Postural Control Postural limitations   Postural Limitations Rounded Shoulders;Forward head   Posture Comments edema in Rt knee   ROM / Strength   AROM / PROM / Strength AROM;Strength   AROM   AROM Assessment Site Knee   Right/Left Knee Right;Left   Right Knee Extension -2   Right Knee Flexion 130   Left Knee Extension 0   Left Knee Flexion 130   Strength   Strength Assessment Site Hip;Knee;Ankle   Right/Left Hip Right  Lt WNL   Right Hip Flexion 4/5  with pain   Right Hip Extension 5/5   Right Hip ABduction 4+/5   Right/Left Knee Right  Lt ext 5/5, flex 4+/5   Right Knee Flexion 4+/5   Right Knee Extension 3/5  with pain   Right/Left Ankle --  bilat WNL   Flexibility   Soft Tissue Assessment /Muscle Length yes   Hamstrings WNL   Quadriceps Rt 120, Lt 135   Quadratus Lumborum tight Lt    Palpation   Spinal mobility hypomobile lower lumbar vertebrae CPA and Lt UPA   Palpation comment verty tight Lt QL and lumbar paraspinals,                    [facility] Adult PT Treatment/Exercise - 02/22/16 0001    Exercises   Exercises Knee/Hip   Knee/Hip Exercises: Stretches   Quad Stretch Both;30 seconds  Rt in s/l due to pain, Lt prone with strap   Knee/Hip Exercises: Supine   Bridges Strengthening;10 reps  then 10 with knee extension   Other Supine Knee/Hip Exercises LTR for back stretching,   Iontophoresis Rt knee tibial tendon, dexamethasone, 6 hr patch                PT Education - 02/22/16 1535    Education provided Yes   Education Details HEP   Person(s) Educated Patient    Methods Explanation;Demonstration;Handout   Comprehension Returned demonstration             PT Long Term Goals - 02/22/16 1542    PT LONG TERM GOAL #1   Title I  with advanced HEP to include return to walking program ( 03/21/16)    Time 4   Period Weeks   Status New   PT LONG TERM GOAL #2   Title increase flexibility of Rt quad to equal Lt ( 03/21/16)    Time 4   Period Weeks   Status New   PT LONG TERM GOAL #3   Title report decrease in Rt knee pain =/> 75% with walking down stairs ( 03/21/16)    Time 4   Period Weeks   Status New   PT LONG TERM GOAL #4   Title report decrease of Lt SIJ/low back pain =/> 50% in the afternoon( 03/21/16)    Time 4   Period Weeks   Status New   PT LONG TERM GOAL #5   Title improve FOTO =/< 33% limited ( 03/21/16)    Time 4   Period Weeks   Status New               Plan - 02/22/16 1539    Clinical Impression Statement 50 yo male with recent onset of Rt knee pain of insidious onset, he reports developing Lt low back pain this past fall and he has been having injections with limited improvement that have made him stop his daily walking.  It is highly likely that he has changed his gait due  to back pain and the lack of walking has decreased his strength leading to Rt knee pain now,  He is also very tight in his quads.    Pt will benefit from skilled therapeutic intervention in order to improve on the following deficits Pain;Increased edema;Decreased strength;Hypomobility;Increased muscle spasms;Difficulty walking   Rehab Potential Good   PT Frequency 2x / week   PT Duration 4 weeks   PT Treatment/Interventions Ultrasound;Neuromuscular re-education;Gait training;Patient/family education;Dry needling;Cryotherapy;Electrical Stimulation;Iontophoresis 4mg /ml Dexamethasone;Moist Heat;Therapeutic exercise;Manual techniques;Vasopneumatic Device   PT Next Visit Plan TDN to Lt QL, lumbar paraspinals, cont ionto to rt patellar tendon, try stim on back  and LE strengthening   Consulted and Agree with Plan of Care Patient         Problem List Patient Active Problem List   Diagnosis Date Noted  . CAD (coronary artery disease) 09/20/2014  . PAF (paroxysmal atrial fibrillation) (HCC) 09/06/2014  . S/P CABG x 2 09/02/2014  . DM2 (diabetes mellitus, type 2) (HCC) 08/31/2014  . HTN (hypertension) 08/31/2014  . HLD (hyperlipidemia) 08/31/2014  . Abnormal nuclear stress test 08/31/2014  . Angina effort (HCC) 08/31/2014  . Chest pain 08/18/2014  . Precordial pain 08/18/2014    Roderic ScarceSusan Shaver PT 02/22/2016, 3:48 PM  [facility] [address] [place], [place], [address] Phone: [phone]   Fax:  [phone]  Name: Michael Duran MRN: [id number] Date of Birth: 03/12/1966

## 2016-02-26 ENCOUNTER — Encounter: Payer: Managed Care, Other (non HMO) | Admitting: Physical Therapy

## 2016-03-06 ENCOUNTER — Ambulatory Visit (INDEPENDENT_AMBULATORY_CARE_PROVIDER_SITE_OTHER): Payer: Managed Care, Other (non HMO) | Admitting: Physical Therapy

## 2016-03-06 DIAGNOSIS — M25661 Stiffness of right knee, not elsewhere classified: Secondary | ICD-10-CM

## 2016-03-06 DIAGNOSIS — M25561 Pain in right knee: Secondary | ICD-10-CM

## 2016-03-06 DIAGNOSIS — M545 Low back pain, unspecified: Secondary | ICD-10-CM

## 2016-03-06 DIAGNOSIS — M6281 Muscle weakness (generalized): Secondary | ICD-10-CM

## 2016-03-06 NOTE — Patient Instructions (Addendum)
Trigger Point Dry Needling  . What is Trigger Point Dry Needling (DN)? o DN is a physical therapy technique used to treat muscle pain and dysfunction. Specifically, DN helps deactivate muscle trigger points (muscle knots).  o A thin filiform needle is used to penetrate the skin and stimulate the underlying trigger point. The goal is for a local twitch response (LTR) to occur and for the trigger point to relax. No medication of any kind is injected during the procedure.   . What Does Trigger Point Dry Needling Feel Like?  o The procedure feels different for each individual patient. Some patients report that they do not actually feel the needle enter the skin and overall the process is not painful. Very mild bleeding may occur. However, many patients feel a deep cramping in the muscle in which the needle was inserted. This is the local twitch response.   Marland Kitchen. How Will I feel after the treatment? o Soreness is normal, and the onset of soreness may not occur for a few hours. Typically this soreness does not last longer than two days.  o Bruising is uncommon, however; ice can be used to decrease any possible bruising.  o In rare cases feeling tired or nauseous after the treatment is normal. In addition, your symptoms may get worse before they get better, this period will typically not last longer than 24 hours.   . What Can I do After My Treatment? o Increase your hydration by drinking more water for the next 24 hours. o You may place ice or heat on the areas treated that have become sore, however, do not use heat on inflamed or bruised areas. Heat often brings more relief post needling. o You can continue your regular activities, but vigorous activity is not recommended initially after the treatment for 24 hours. o DN is best combined with other physical therapy such as strengthening, stretching, and other therapies.     Outer Hip Stretch: Reclined IT Band Stretch (Strap)    Strap around opposite  foot, pull across only as far as possible with shoulders on mat. Hold for __30-45__ sec. Repeat __2__ times each leg.  Copyright  VHI. All rights reserved.

## 2016-03-06 NOTE — Therapy (Signed)
[place], [place], [address] Phone: [phone]   Fax:  [phone]  Physical Therapy Treatment  Patient Details  Name: Michael Duran MRN: [id number] Date of Birth: [birth year] Referring Provider: Dr Moreen Fowler  Encounter Date: 03/06/2016      PT End of Session - 03/06/16 0733    Visit Number 2   Number of Visits 8   Date for PT Re-Evaluation 03/21/16   PT Start Time 0733   PT Stop Time 0820   PT Time Calculation (min) 47 min   Activity Tolerance Patient tolerated treatment well      Past Medical History  Diagnosis Date  . Hyperlipemia     mild  . HTN (hypertension)   . Gout   . Kidney stone     "passed it"  . Coronary artery disease   . Sleep apnea     not an issue with weight loss in 2006  . Diabetes ([place])     not since losing weight in 2006  . Chronic lower back pain     Past Surgical History  Procedure Laterality Date  . Lumbar laminectomy  11/2003  . Cardiac catheterization  08/31/2014  . Back surgery    . Tonsillectomy  ~ 1970  . Coronary artery bypass graft N/A 09/02/2014    Procedure: CORONARY ARTERY BYPASS GRAFTING (CABG), ON PUMP, TIMES TWO, USING BILATERAL INTERNAL MAMMARY ARTERIES.;  Surgeon: Melrose Nakayama, MD;  Location: [place];  Service: Open Heart Surgery;  Laterality: N/A;  CABG w/ bilateral mammaries  . Tee without cardioversion N/A 09/02/2014    Procedure: TRANSESOPHAGEAL ECHOCARDIOGRAM (TEE);  Surgeon: Melrose Nakayama, MD;  Location: [place];  Service: Open Heart Surgery;  Laterality: N/A;  . Left heart catheterization with coronary angiogram N/A 08/31/2014    Procedure: LEFT HEART CATHETERIZATION WITH CORONARY ANGIOGRAM;  Surgeon: Sinclair Grooms, MD;  Location: [facility] CATH LAB;  Service: Cardiovascular;  Laterality: N/A;    There were no vitals filed for this visit.      Subjective Assessment - 03/06/16 0733    Subjective Pt reports he is performing his HEP  as able, he does think he is getting better.     Currently in Pain? No/denies  last time he had pain in his knee was Sat AM getting out of bed. Back always has some pain.                          [facility] Adult PT Treatment/Exercise - 03/06/16 0001    Knee/Hip Exercises: Stretches   Other Knee/Hip Stretches cat/cow x 10, BKTC   Other Knee/Hip Stretches supine cross body stretch with strap 2x30sec bilat   Knee/Hip Exercises: Standing   SLS Rt with mini squat at counter top, minimal motion   Modalities   Modalities Iontophoresis;Electrical Stimulation;Moist Heat   Moist Heat Therapy   Number Minutes Moist Heat 15 Minutes   Moist Heat Location Lumbar Spine   Electrical Stimulation   Electrical Stimulation Location Lt low back   Electrical Stimulation Action IFC   Electrical Stimulation Parameters to tolerance   Electrical Stimulation Goals Pain;Tone   Iontophoresis   Type of Iontophoresis Dexamethasone   Location Rt patellar tendon   Dose 1.3cc   Time 13 hr patch   Manual Therapy   Manual Therapy Soft tissue mobilization;Joint mobilization   Joint Mobilization grade III CPA and Lt UPA  mobs L5-2  Soft tissue mobilization Lt QL and multifidi          Trigger Point Dry Needling - 03/06/16 0749    Consent Given? Yes   Education Handout Provided Yes   Muscles Treated Upper Body Longissimus;Quadratus Lumborum  Lt side   Longissimus Response Palpable increased muscle length;Twitch response elicited              PT Education - 03/06/16 0749    Education provided Yes   Education Details TDN   Person(s) Educated Patient   Methods Explanation;Handout   Comprehension Verbalized understanding             PT Long Term Goals - 03/06/16 1324    PT LONG TERM GOAL #1   Title I with advanced HEP to include return to walking program ( 03/21/16)    Time 4   Status On-going   PT LONG TERM GOAL #2   Title increase flexibility of Rt quad to equal Lt (  03/21/16)    Time 4   Period Weeks   Status On-going  still has some tightness   PT LONG TERM GOAL #3   Title report decrease in Rt knee pain =/> 75% with walking down stairs ( 03/21/16)    Time 4   Period Weeks   Status On-going   PT LONG TERM GOAL #4   Title report decrease of Lt SIJ/low back pain =/> 50% in the afternoon( 03/21/16)    Time 4   Period Weeks   Status On-going   PT LONG TERM GOAL #5   Title improve FOTO =/< 33% limited ( 03/21/16)    Time 4   Period Weeks   Status On-going               Plan - 03/06/16 0824    Clinical Impression Statement This is Michael Duran's second visit he wasn't able to attend last week.  He is having some decreased knee pain.  No goals have been met.  He had a good response to TDN and this loosened the musculature in his back and will hopefullly allow him to have a more normal gait on steps.    Rehab Potential Good   PT Frequency 2x / week   PT Duration 4 weeks   PT Treatment/Interventions Ultrasound;Neuromuscular re-education;Gait training;Patient/family education;Dry needling;Cryotherapy;Electrical Stimulation;Iontophoresis 46m/ml Dexamethasone;Moist Heat;Therapeutic exercise;Manual techniques;Vasopneumatic Device   PT Next Visit Plan progress HEP - eccentric quad work, assess response to TDN      Patient will benefit from skilled therapeutic intervention in order to improve the following deficits and impairments:  Pain, Increased edema, Decreased strength, Hypomobility, Increased muscle spasms, Difficulty walking  Visit Diagnosis: Pain in right knee  Left-sided low back pain without sciatica  Muscle weakness (generalized)  Stiffness of right knee, not elsewhere classified     Problem List Patient Active Problem List   Diagnosis Date Noted  . CAD (coronary artery disease) 09/20/2014  . PAF (paroxysmal atrial fibrillation) ([facility] 09/06/2014  . S/P CABG x 2 09/02/2014  . DM2 (diabetes mellitus, type 2) ([facility] 08/31/2014  . HTN  (hypertension) 08/31/2014  . HLD (hyperlipidemia) 08/31/2014  . Abnormal nuclear stress test 08/31/2014  . Angina effort ([facility] 08/31/2014  . Chest pain 08/18/2014  . Precordial pain 08/18/2014    SJeral PinchPT 03/06/2016, 8:27 AM  [facility] [address]Phone: [phone]  Fax:  [phone] Name: Michael JACOBERMRN: [id number]  of Birth: Apr 06, 1966

## 2016-03-18 ENCOUNTER — Encounter: Payer: Managed Care, Other (non HMO) | Admitting: Physical Therapy

## 2016-03-20 ENCOUNTER — Ambulatory Visit (INDEPENDENT_AMBULATORY_CARE_PROVIDER_SITE_OTHER): Payer: Managed Care, Other (non HMO) | Admitting: Physical Therapy

## 2016-03-20 DIAGNOSIS — M545 Low back pain, unspecified: Secondary | ICD-10-CM

## 2016-03-20 DIAGNOSIS — M25661 Stiffness of right knee, not elsewhere classified: Secondary | ICD-10-CM

## 2016-03-20 DIAGNOSIS — M25561 Pain in right knee: Secondary | ICD-10-CM

## 2016-03-20 DIAGNOSIS — M6281 Muscle weakness (generalized): Secondary | ICD-10-CM

## 2016-03-20 NOTE — Therapy (Addendum)
[place], [place], [address] Phone: [phone]   Fax:  [phone]  Physical Therapy Treatment  Patient Details  Name: Michael Duran MRN: [id number] Date of Birth: Mar 30, 1966 Referring Provider: Dr Moreen Fowler  Encounter Date: 03/20/2016      PT End of Session - 03/20/16 1017    Visit Number 3   Number of Visits 8   Date for PT Re-Evaluation 03/21/16   PT Start Time 0935   PT Stop Time 1017   PT Time Calculation (min) 42 min   Activity Tolerance Patient tolerated treatment well      Past Medical History  Diagnosis Date  . Hyperlipemia     mild  . HTN (hypertension)   . Gout   . Kidney stone     "passed it"  . Coronary artery disease   . Sleep apnea     not an issue with weight loss in 2006  . Diabetes ([place])     not since losing weight in 2006  . Chronic lower back pain     Past Surgical History  Procedure Laterality Date  . Lumbar laminectomy  11/2003  . Cardiac catheterization  08/31/2014  . Back surgery    . Tonsillectomy  ~ 1970  . Coronary artery bypass graft N/A 09/02/2014    Procedure: CORONARY ARTERY BYPASS GRAFTING (CABG), ON PUMP, TIMES TWO, USING BILATERAL INTERNAL MAMMARY ARTERIES.;  Surgeon: Melrose Nakayama, MD;  Location: [place];  Service: Open Heart Surgery;  Laterality: N/A;  CABG w/ bilateral mammaries  . Tee without cardioversion N/A 09/02/2014    Procedure: TRANSESOPHAGEAL ECHOCARDIOGRAM (TEE);  Surgeon: Melrose Nakayama, MD;  Location: [place];  Service: Open Heart Surgery;  Laterality: N/A;  . Left heart catheterization with coronary angiogram N/A 08/31/2014    Procedure: LEFT HEART CATHETERIZATION WITH CORONARY ANGIOGRAM;  Surgeon: Sinclair Grooms, MD;  Location: [facility] CATH LAB;  Service: Cardiovascular;  Laterality: N/A;    There were no vitals filed for this visit.      Subjective Assessment - 03/20/16 0942    Subjective Pt reports his knee is doing  better, able to go down stairs without pain. Leaving for big golf trip tomorrow.    Patient Stated Goals return to walking program,             [facility] PT Assessment - 03/20/16 0001    Assessment   Medical Diagnosis Rt knee pain   Referring Provider Dr Moreen Fowler   Onset Date/Surgical Date 02/01/16   Hand Dominance Right   Next MD Visit not scheduled   Observation/Other Assessments   Focus on Therapeutic Outcomes (FOTO)  34% limited                     [facility] Adult PT Treatment/Exercise - 03/20/16 0001    Knee/Hip Exercises: Stretches   Passive Hamstring Stretch Left;3 reps;30 seconds  due to spasms supine and seated   Other Knee/Hip Stretches cat/cow x 10, BKTC   Knee/Hip Exercises: Standing   Step Down 10 reps;Both;Step Height: 8"  VC for form   Knee/Hip Exercises: Supine   Other Supine Knee/Hip Exercises pelvic press series lower and upper body    Modalities   Modalities Iontophoresis   Iontophoresis   Type of Iontophoresis Dexamethasone   Location Rt patellar tendon   Dose 1.3cc   Time 13 hr patch  PT Education - 03/20/16 1001    Education Details HEP for back and eccentric for knee   Person(s) Educated Patient   Methods Explanation;Demonstration;Handout   Comprehension Returned demonstration;Verbalized understanding             PT Long Term Goals - 03/20/16 0943    PT LONG TERM GOAL #1   Title I with advanced HEP to include return to walking program ( 03/21/16)    Status Achieved   PT LONG TERM GOAL #2   Title increase flexibility of Rt quad to equal Lt ( 03/21/16)    Status Achieved  however bilat quads are still tight   PT LONG TERM GOAL #3   Title report decrease in Rt knee pain =/> 75% with walking down stairs ( 03/21/16)    Status Achieved  no pain   PT LONG TERM GOAL #4   Title report decrease of Lt SIJ/low back pain =/> 50% in the afternoon( 03/21/16)    Status On-going   PT LONG TERM GOAL #5   Title improve FOTO  =/< 33% limited ( 03/21/16)    Status On-going               Plan - 03/20/16 1018    Clinical Impression Statement Michael Duran reports his knee feels better, he does still have some higher level weakness with eccentric exercise,  His back still gives him some issues.  He was fatigued after performing the pelvic press series.  He has partially met his goals.  He would benefit from more PT however he reports it is expensive.  He wishes to perform HEP for a couple weeks and see how he does on his own.    Rehab Potential Good   PT Frequency 2x / week   PT Duration 4 weeks   PT Treatment/Interventions Ultrasound;Neuromuscular re-education;Gait training;Patient/family education;Dry needling;Cryotherapy;Electrical Stimulation;Iontophoresis 45m/ml Dexamethasone;Moist Heat;Therapeutic exercise;Manual techniques;Vasopneumatic Device   PT Next Visit Plan pt to perform HEP for a couple weeks, if he doesn't call to return for renewal we will discharge.    Consulted and Agree with Plan of Care Patient      Patient will benefit from skilled therapeutic intervention in order to improve the following deficits and impairments:  Pain, Increased edema, Decreased strength, Hypomobility, Increased muscle spasms, Difficulty walking  Visit Diagnosis: Pain in right knee  Left-sided low back pain without sciatica  Muscle weakness (generalized)  Stiffness of right knee, not elsewhere classified     Problem List Patient Active Problem List   Diagnosis Date Noted  . CAD (coronary artery disease) 09/20/2014  . PAF (paroxysmal atrial fibrillation) ([facility] 09/06/2014  . S/P CABG x 2 09/02/2014  . DM2 (diabetes mellitus, type 2) ([facility] 08/31/2014  . HTN (hypertension) 08/31/2014  . HLD (hyperlipidemia) 08/31/2014  . Abnormal nuclear stress test 08/31/2014  . Angina effort ([facility] 08/31/2014  . Chest pain 08/18/2014  . Precordial pain 08/18/2014    SJeral PinchPT 03/20/2016, 10:20 AM  [facility] [address] [address]Phone: [phone]  Fax:  [phone] Name: Michael SCHOOLSMRN: [id number] of Birth: 7Dec 12, 1967   PHYSICAL THERAPY DISCHARGE SUMMARY  Visits from Start of Care: 3  Current functional level related to goals / functional outcomes: See above from last visit   Remaining deficits: unknown   Education / Equipment: HEP Plan: Patient agrees to discharge.  Patient goals were not met. Patient is being discharged due to  the patient's request. patient with concerns about financial concerns.  ?????    Jeral Pinch, PT 05/09/2016 10:54 AM

## 2016-03-20 NOTE — Patient Instructions (Signed)
Pelvic Press     Place hands under belly between navel and pubic bone, palms up. Feel pressure on hands. Increase pressure on hands by pressing pelvis down. This is NOT a pelvic tilt. Hold __5_ seconds. Relax. Repeat _10__ times. Once a day.  KNEE: Flexion - Prone - hold hands under pelvis - don't have to perform single leg, can move right to both knees.    Hold pelvic press. Bend knee, then return the foot down. Repeat on opposite leg. Do not raise hips. _10__ reps per set. When this is mastered, pull both heels up at same time, x 10 reps.  Once a day   Leg Lift: One-Leg   Press pelvis down. Keep knee straight; lengthen and lift one leg (from waist). Do not twist body. Keep other leg down. Hold _1__ seconds. Relax. Repeat 10 time. Repeat with other leg.  HIP: Extension / KNEE: Flexion - Prone    Hold pelvic press. Bend knee up, hold. Raise leg up  10___ reps per set, _1__ sets per day, _1__ time a day.   Axial Extension- Upper body sequence * always start with pelvic press    Lie on stomach with forehead resting on floor and arms at sides. Tuck chin in and raise head from floor without bending it up or down. Repeat ___10_ times per set. Do __1__ sets per session. Do _1___ sessions per day.  Progression:  Arms at side Arms in T shape Arms in W shape  Arms in M shape Arms in Y shape  [facility] [address] [place], [place] [address]  [phone] (office) [phone] (fax)  Stretching: Hamstring  - Do sitting not standing    Place left foot out in front of you. Slowly lean forward, keeping back straight, until stretch is felt in back of thigh, bring your belly to your thigh. Hold _30-45___ seconds. Repeat __1-2__ times per set. Do __1__ sets per session. Do __1__ sessions per day.  Quad Strength, Proprioception: Step Over    Standing on step, lower one heel to the floor. Return to start. Keep knee lined up  with foot.  Use __6-8__ inch step. Repeat _3x10___ times. Do __1__ sessions per day.  Copyright  VHI. All rights reserved.

## 2016-04-20 ENCOUNTER — Other Ambulatory Visit: Payer: Self-pay | Admitting: Cardiology

## 2016-06-14 ENCOUNTER — Other Ambulatory Visit: Payer: Self-pay | Admitting: Cardiology

## 2016-06-14 NOTE — Telephone Encounter (Signed)
REFILL 

## 2016-07-04 ENCOUNTER — Encounter: Payer: Self-pay | Admitting: Cardiology

## 2016-07-10 ENCOUNTER — Telehealth: Payer: Self-pay | Admitting: Cardiology

## 2016-07-10 NOTE — Telephone Encounter (Signed)
Closed encounter °

## 2016-07-18 ENCOUNTER — Ambulatory Visit: Payer: Managed Care, Other (non HMO) | Admitting: Cardiology

## 2016-07-21 ENCOUNTER — Other Ambulatory Visit: Payer: Self-pay | Admitting: Cardiology

## 2016-07-22 NOTE — Telephone Encounter (Signed)
Rx(s) sent to pharmacy electronically.  

## 2016-08-15 NOTE — Progress Notes (Signed)
HPI The patient for followup after CABG.  Since I last saw him he has gained weight.   He has had problems with his back does not exercise. He is under more stress. Emotionally he is also feeling more aches and pains in his chest that he had previously. He's not having any angina before. He denies any shortness of breath, PND or orthopnea. He's had no palpitations, presyncope or syncope.   No Known Allergies  Current Outpatient Prescriptions  Medication Sig Dispense Refill  . amLODipine (NORVASC) 10 MG tablet TAKE 1 TABLET (10 MG TOTAL) BY MOUTH DAILY. 90 tablet 2  . aspirin 81 MG tablet Take 81 mg by mouth daily.    Marland Kitchen atorvastatin (LIPITOR) 80 MG tablet Take 1 tablet (80 mg total) by mouth daily. 90 tablet 1  . colchicine 0.6 MG tablet Take 0.6 mg by mouth daily. Reported on 02/22/2016    . Fish Oil-Cholecalciferol (FISH OIL + D3 PO) Take by mouth.    . losartan (COZAAR) 100 MG tablet TAKE 1 TABLET (100 MG TOTAL) BY MOUTH DAILY. 90 tablet 2  . metoprolol succinate (TOPROL-XL) 25 MG 24 hr tablet Take 1 tablet (25 mg total) by mouth daily. NEED OV. 30 tablet 0   No current facility-administered medications for this visit.     Past Medical History:  Diagnosis Date  . Chronic lower back pain   . Coronary artery disease   . Diabetes (HCC)    not since losing weight in 2006  . Gout   . HTN (hypertension)   . Hyperlipemia    mild  . Kidney stone    "passed it"  . Sleep apnea    not an issue with weight loss in 2006    Past Surgical History:  Procedure Laterality Date  . BACK SURGERY    . CARDIAC CATHETERIZATION  08/31/2014  . CORONARY ARTERY BYPASS GRAFT N/A 09/02/2014   Procedure: CORONARY ARTERY BYPASS GRAFTING (CABG), ON PUMP, TIMES TWO, USING BILATERAL INTERNAL MAMMARY ARTERIES.;  Surgeon: Loreli Slot, MD;  Location: [facility] OR;  Service: Open Heart Surgery;  Laterality: N/A;  CABG w/ bilateral mammaries  . LEFT HEART CATHETERIZATION WITH CORONARY ANGIOGRAM N/A 08/31/2014     Procedure: LEFT HEART CATHETERIZATION WITH CORONARY ANGIOGRAM;  Surgeon: Lesleigh Noe, MD;  Location: [facility] CATH LAB;  Service: Cardiovascular;  Laterality: N/A;  . LUMBAR LAMINECTOMY  11/2003  . TEE WITHOUT CARDIOVERSION N/A 09/02/2014   Procedure: TRANSESOPHAGEAL ECHOCARDIOGRAM (TEE);  Surgeon: Loreli Slot, MD;  Location: [facility] OR;  Service: Open Heart Surgery;  Laterality: N/A;  . TONSILLECTOMY  ~ 1970    ROS:  ED.  Otherwise as stated in the HPI and negative for all other systems.  PHYSICAL EXAM BP (!) 136/93   Pulse 64   Ht 5\' 10"  (1.778 m)   Wt 230 lb 9.6 oz (104.6 kg)   BMI 33.09 kg/m  GENERAL:  Well appearing NECK:  No jugular venous distention, waveform within normal limits, carotid upstroke brisk and symmetric, no bruits, no thyromegaly LUNGS:  Clear to auscultation bilaterally CHEST:  Well healed sternotomy scar. HEART:  PMI not displaced or sustained,S1 and S2 within normal limits, no S3, no S4, no clicks, no rubs, very soft apical murmurs non radiating. ABD:  Flat, positive bowel sounds normal in frequency in pitch, no bruits, no rebound, no guarding, no midline pulsatile mass, no hepatomegaly, no splenomegaly EXT:  2 plus pulses throughout, no edema, no cyanosis no clubbing  EKG:  Sinus rhythm, rate 64, axis within normal limits, intervals within normal limits, no acute ST-T wave changes.    ASSESSMENT AND PLAN   CAD:   He is having some vague symptoms. I don't think he's having any obstructive coronary disease symptoms but I am going to bring him back for a POET (Plain Old Exercise Treadmill)   DYSLIPIDEMIA:  His LDL 76 with and HDL of 46 in March.    He will continue with the meds as listed.   HTN:  The blood pressure is at target. No change in medications is indicated. We will continue with therapeutic lifestyle changes (TLC).  OBESITY:  We discussed this at length.

## 2016-08-16 ENCOUNTER — Ambulatory Visit (INDEPENDENT_AMBULATORY_CARE_PROVIDER_SITE_OTHER): Payer: Managed Care, Other (non HMO) | Admitting: Cardiology

## 2016-08-16 ENCOUNTER — Encounter: Payer: Self-pay | Admitting: Cardiology

## 2016-08-16 ENCOUNTER — Ambulatory Visit: Payer: Managed Care, Other (non HMO) | Admitting: Cardiology

## 2016-08-16 VITALS — BP 136/93 | HR 64 | Ht 70.0 in | Wt 230.6 lb

## 2016-08-16 DIAGNOSIS — I2510 Atherosclerotic heart disease of native coronary artery without angina pectoris: Secondary | ICD-10-CM | POA: Diagnosis not present

## 2016-08-16 NOTE — Patient Instructions (Addendum)
Medication Instructions:  Continue current medications  Labwork: None Ordered  Testing/Procedures: Your physician has requested that you have an exercise tolerance test in October. For further information please visit [email or web address] Please also follow instruction sheet, as given.  Follow-Up: Your physician wants you to follow-up in: 6 months. You will receive a reminder letter in the mail two months in advance. If you don't receive a letter, please call our office to schedule the follow-up appointment.   Any Other Special Instructions Will Be Listed Below (If Applicable).     If you need a refill on your cardiac medications before your next appointment, please call your pharmacy.

## 2016-08-26 ENCOUNTER — Ambulatory Visit: Payer: Managed Care, Other (non HMO) | Admitting: Cardiology

## 2016-09-12 ENCOUNTER — Telehealth (HOSPITAL_COMMUNITY): Payer: Self-pay

## 2016-09-12 NOTE — Telephone Encounter (Signed)
Encounter complete. 

## 2016-09-17 ENCOUNTER — Inpatient Hospital Stay (HOSPITAL_COMMUNITY): Admission: RE | Admit: 2016-09-17 | Payer: Managed Care, Other (non HMO) | Source: Ambulatory Visit

## 2016-09-22 ENCOUNTER — Other Ambulatory Visit: Payer: Self-pay | Admitting: Cardiology

## 2016-09-23 NOTE — Telephone Encounter (Signed)
Spoke with patient and he will try to get copy of labs and bring to ETT in December  Confirmed with patient he is taking Colchicine and has been Ok per Roney MarionKristin Pharm D if tolerating

## 2016-10-14 ENCOUNTER — Other Ambulatory Visit: Payer: Self-pay | Admitting: Cardiology

## 2016-10-15 NOTE — Telephone Encounter (Signed)
Rx request sent to pharmacy.  

## 2016-11-15 ENCOUNTER — Telehealth (HOSPITAL_COMMUNITY): Payer: Self-pay

## 2016-11-15 NOTE — Telephone Encounter (Signed)
Encounter complete. 

## 2016-11-19 ENCOUNTER — Telehealth (HOSPITAL_COMMUNITY): Payer: Self-pay

## 2016-11-19 NOTE — Telephone Encounter (Signed)
Encounter complete. 

## 2016-11-20 ENCOUNTER — Inpatient Hospital Stay (HOSPITAL_COMMUNITY): Admission: RE | Admit: 2016-11-20 | Payer: Managed Care, Other (non HMO) | Source: Ambulatory Visit

## 2016-12-06 ENCOUNTER — Encounter: Payer: Self-pay | Admitting: *Deleted

## 2016-12-06 ENCOUNTER — Emergency Department
Admission: EM | Admit: 2016-12-06 | Discharge: 2016-12-06 | Disposition: A | Discharge status: Home / Self Care | Payer: Commercial Managed Care - PPO | Source: Home / Self Care | Attending: Family Medicine | Admitting: Family Medicine

## 2016-12-06 DIAGNOSIS — M10062 Idiopathic gout, left knee: Secondary | ICD-10-CM

## 2016-12-06 MED ORDER — METHYLPREDNISOLONE SODIUM SUCC 125 MG IJ SOLR
125.0000 mg | Freq: Once | INTRAMUSCULAR | Status: AC
  Administered 2016-12-06: 125 mg via INTRAMUSCULAR

## 2016-12-06 MED ORDER — PREDNISONE 50 MG PO TABS: 50.0000 mg | ORAL_TABLET | Freq: Every day | ORAL | 0 refills | Status: DC

## 2016-12-06 NOTE — ED Provider Notes (Signed)
CSN: [id number]     Arrival date & time 12/06/16  8295 History   None    Chief Complaint  Patient presents with  . Knee Pain   (Consider location/radiation/quality/duration/timing/severity/associated sxs/prior Treatment) HPI Michael Duran is a 51 y.o. male presenting to UC with c/o sudden onset Left knee pain that woke him at 4AM this morning.  Pain is burning and aching.  Pt states it felt like it was "in a vice."  Hx of gout.  He is on colchicine daily, which has helped hold off flares significantly.  Hx of last flare about 1 year ago.  He notes last time he was seen in UC he got a shot and was prescribed prednisone for 5 days. He states symptoms resolved within 2-3 days of the shot.  He has had gout since he was about 51 years old. He is unsure what causes it to flare up.  No recent injury to the knee.   Past Medical History:  Diagnosis Date  . Chronic lower back pain   . Coronary artery disease   . Diabetes (HCC)    not since losing weight in 2006  . Gout   . HTN (hypertension)   . Hyperlipemia    mild  . Kidney stone    "passed it"  . Sleep apnea    not an issue with weight loss in 2006   Past Surgical History:  Procedure Laterality Date  . BACK SURGERY    . CARDIAC CATHETERIZATION  08/31/2014  . CORONARY ARTERY BYPASS GRAFT N/A 09/02/2014   Procedure: CORONARY ARTERY BYPASS GRAFTING (CABG), ON PUMP, TIMES TWO, USING BILATERAL INTERNAL MAMMARY ARTERIES.;  Surgeon: Loreli Slot, MD;  Location: [facility] OR;  Service: Open Heart Surgery;  Laterality: N/A;  CABG w/ bilateral mammaries  . LEFT HEART CATHETERIZATION WITH CORONARY ANGIOGRAM N/A 08/31/2014   Procedure: LEFT HEART CATHETERIZATION WITH CORONARY ANGIOGRAM;  Surgeon: Lesleigh Noe, MD;  Location[facility] C[facility] LAB;  Service: Cardiovascular;  Laterality: N/A;  . LUMBAR LAMINECTOMY  11/2003  . TEE WITHOUT CARDIOVERSION N/A 09/02/2014   Procedure: TRANSESOPHAGEAL ECHOCARDIOGRAM (TEE);  Surgeon: Loreli Slot, MD;   Location: [facility] OR;  Service: Open Heart Surgery;  Laterality: N/A;  . TONSILLECTOMY  ~ 1970   Family History  Problem Relation Age of Onset  . CAD Mother 54    CABG  . Heart failure Mother   . CAD Father 42    AVR  . Heart attack Father   . Diabetes Brother   . Stroke Neg Hx    Social History  Substance Use Topics  . Smoking status: Never Smoker  . Smokeless tobacco: Never Used  . Alcohol use No    Review of Systems  Constitutional: Negative for chills and fever.  Musculoskeletal: Positive for arthralgias and myalgias. Negative for joint swelling.  Skin: Positive for color change. Negative for rash and wound.  Neurological: Negative for weakness and numbness.    Allergies  Patient has no known allergies.  Home Medications   Prior to Admission medications   Medication Sig Start Date End Date Taking? Authorizing Provider  amLODipine (NORVASC) 10 MG tablet TAKE 1 TABLET (10 MG TOTAL) BY MOUTH DAILY. 07/22/16   Rollene Rotunda, MD  aspirin 81 MG tablet Take 81 mg by mouth daily.    Historical Provider, MD  atorvastatin (LIPITOR) 80 MG tablet Take 1 tablet (80 mg total) by mouth daily. 10/05/15   Rollene Rotunda, MD  atorvastatin (LIPITOR) 80 MG  tablet TAKE ONE TABLET BY MOUTH ONE TIME DAILY 09/23/16   Rollene RotundaJames Hochrein, MD  colchicine 0.6 MG tablet Take 0.6 mg by mouth daily. Reported on 02/22/2016    Historical Provider, MD  Fish Oil-Cholecalciferol (FISH OIL + D3 PO) Take by mouth.    Historical Provider, MD  losartan (COZAAR) 100 MG tablet TAKE 1 TABLET (100 MG TOTAL) BY MOUTH DAILY. 07/22/16   Rollene RotundaJames Hochrein, MD  metoprolol succinate (TOPROL-XL) 25 MG 24 hr tablet TAKE ONE TABLET BY MOUTH ONE TIME DAILY 10/15/16   Rollene RotundaJames Hochrein, MD  predniSONE (DELTASONE) 50 MG tablet Take 1 tablet (50 mg total) by mouth daily with breakfast. For 5 days 12/06/16   Junius FinnerErin O'Malley, PA-C   Meds Ordered and Administered this Visit   Medications  methylPREDNISolone sodium succinate (SOLU-MEDROL) 125  mg/2 mL injection 125 mg (125 mg Intramuscular Given 12/06/16 0834)    BP 116/79 (BP Location: Left Arm)   Pulse 76   Temp 97.8 F (36.6 C) (Oral)   Resp 18   Ht 5\' 10"  (1.778 m)   Wt 225 lb (102.1 kg)   SpO2 96%   BMI 32.28 kg/m  No data found.   Physical Exam  Constitutional: He is oriented to person, place, and time. He appears well-developed and well-nourished. No distress.  HENT:  Head: Normocephalic and atraumatic.  Eyes: EOM are normal.  Neck: Normal range of motion.  Cardiovascular: Normal rate.   Pulmonary/Chest: Effort normal.  Musculoskeletal: Normal range of motion. He exhibits tenderness. He exhibits no edema.  Left knee: no obvious edema. Tenderness to medial aspect of knee. Full ROM w/o crepitus.  Calf is soft, non-tender.  Neurological: He is alert and oriented to person, place, and time.  Skin: Skin is warm and dry. No rash noted. He is not diaphoretic. There is erythema.  Left knee: skin in tact. Faint erythema to medial aspect, warm to touch.  Psychiatric: He has a normal mood and affect. His behavior is normal.  Nursing note and vitals reviewed.   Urgent Care Course   Clinical Course     Procedures (including critical care time)  Labs Review Labs Reviewed - No data to display  Imaging Review No results found.    MDM   1. Acute idiopathic gout of left knee    Pt c/o pain and redness to Left knee. C/w prior gout flares. No indication for imaging at this time. Doubt septic joint.  Tx in UC: Solumedrol 80mg  IM Rx: Prednisone 50mg  once daily for 5 days to start tomorrow F/u with PCP if not improving in 1 week.    Junius FinnerErin O'Malley, PA-C 12/06/16 [phone]

## 2016-12-06 NOTE — ED Triage Notes (Signed)
Pt c/o LT knee pain, swelling and redness x 0430 today. Hx of gout. Denies injury.

## 2016-12-06 NOTE — Discharge Instructions (Signed)
°  You have been prescribed 5 days of prednisone, an oral steroid to help with inflammation and pain in your knee.  You may start this medication tomorrow with breakfast as it can make it difficult to sleep if taken at night and you already received your first dose in the shot given at Urgent Care today- Solumedrol.

## 2017-01-15 ENCOUNTER — Telehealth (HOSPITAL_COMMUNITY): Payer: Self-pay | Admitting: Cardiology

## 2017-01-15 NOTE — Telephone Encounter (Signed)
Patient returned my call in regards to getting rescheduled for the ETT that he cancelled. He voiced that he would discuss the need for this test at his next visit with Dr. [place] LionsHochrien in the spring. For now he will be removed from the workqueue until a definitive decision is made.

## 2017-01-21 ENCOUNTER — Inpatient Hospital Stay (HOSPITAL_COMMUNITY): Admission: RE | Admit: 2017-01-21 | Payer: Managed Care, Other (non HMO) | Source: Ambulatory Visit

## 2017-02-07 ENCOUNTER — Other Ambulatory Visit: Payer: Self-pay | Admitting: Cardiology

## 2017-02-07 NOTE — Telephone Encounter (Signed)
Rx(s) sent to pharmacy electronically.  

## 2017-02-21 ENCOUNTER — Other Ambulatory Visit: Payer: Self-pay | Admitting: Cardiology

## 2017-02-21 NOTE — Telephone Encounter (Signed)
Rx request sent to pharmacy.  

## 2017-02-22 ENCOUNTER — Other Ambulatory Visit: Payer: Self-pay | Admitting: Cardiology

## 2017-02-24 ENCOUNTER — Other Ambulatory Visit: Payer: Self-pay | Admitting: Cardiology

## 2017-02-24 NOTE — Telephone Encounter (Signed)
Rx request sent to pharmacy.  

## 2017-03-11 ENCOUNTER — Other Ambulatory Visit: Payer: Self-pay | Admitting: *Deleted

## 2017-03-11 NOTE — Telephone Encounter (Signed)
Patient left a msg on the refill vm requesting that these be changed to a ninety day supply.

## 2017-03-12 ENCOUNTER — Other Ambulatory Visit: Payer: Self-pay

## 2017-03-12 MED ORDER — METOPROLOL SUCCINATE ER 25 MG PO TB24: 25.0000 mg | ORAL_TABLET | Freq: Every day | ORAL | 3 refills | Status: DC

## 2017-03-12 MED ORDER — ATORVASTATIN CALCIUM 80 MG PO TABS: 80.0000 mg | ORAL_TABLET | Freq: Every day | ORAL | 1 refills | Status: DC

## 2017-03-14 ENCOUNTER — Telehealth: Payer: Self-pay | Admitting: Cardiology

## 2017-03-14 MED ORDER — METOPROLOL SUCCINATE ER 25 MG PO TB24: 25.0000 mg | ORAL_TABLET | Freq: Every day | ORAL | 0 refills | Status: DC

## 2017-03-14 NOTE — Telephone Encounter (Signed)
Patient walked in requesting updated on Rx refill(s)   Atorvastatin  atorvastatin (LIPITOR) 80 MG tablet 30 tablet 6 02/24/2017    Sig: TAKE ONE TABLET BY MOUTH ONE TIME DAILY   E-Prescribing Status: Receipt confirmed by pharmacy (02/24/2017 1:45 PM EDT)   Pharmacy   [facility] [phone] - [place], [place] - [address]   atorvastatin (LIPITOR) 80 MG tablet 90 tablet 1 03/12/2017    Sig - Route: Take 1 tablet (80 mg total) by mouth daily. - Oral   E-Prescribing Status: Receipt confirmed by pharmacy (03/12/2017 8:33 AM EDT)   Pharmacy   [facility] [phone] - [place], [place] - [address]     Metoprolol Succiante metoprolol succinate (TOPROL-XL) 25 MG 24 hr tablet 30 tablet 3 03/12/2017    Sig - Route: Take 1 tablet (25 mg total) by mouth daily. PLEASE CONTACT OFFICE FOR ADDITIONAL REFILLS - Oral   E-Prescribing Status: Receipt confirmed by pharmacy (03/12/2017 8:33 AM EDT)   Pharmacy   [facility] [phone] - [place], [place] - [address]    Patient notified that both Rx(s) have been refilled as on 03/12/17. He requests 90 day supply for each. Will update Rx. He is aware he needs to schedule 6 month appt with Dr. Antoine Poche. Patient taken to scheduler.

## 2017-04-17 ENCOUNTER — Emergency Department (INDEPENDENT_AMBULATORY_CARE_PROVIDER_SITE_OTHER): Payer: Commercial Managed Care - PPO

## 2017-04-17 ENCOUNTER — Emergency Department
Admission: EM | Admit: 2017-04-17 | Discharge: 2017-04-17 | Disposition: A | Discharge status: Home / Self Care | Payer: Commercial Managed Care - PPO | Source: Home / Self Care | Attending: Family Medicine | Admitting: Family Medicine

## 2017-04-17 ENCOUNTER — Encounter: Payer: Self-pay | Admitting: *Deleted

## 2017-04-17 DIAGNOSIS — M5489 Other dorsalgia: Secondary | ICD-10-CM

## 2017-04-17 DIAGNOSIS — M25562 Pain in left knee: Secondary | ICD-10-CM

## 2017-04-17 DIAGNOSIS — M1000 Idiopathic gout, unspecified site: Secondary | ICD-10-CM

## 2017-04-17 DIAGNOSIS — R12 Heartburn: Secondary | ICD-10-CM

## 2017-04-17 DIAGNOSIS — B9789 Other viral agents as the cause of diseases classified elsewhere: Secondary | ICD-10-CM | POA: Diagnosis not present

## 2017-04-17 DIAGNOSIS — M546 Pain in thoracic spine: Secondary | ICD-10-CM

## 2017-04-17 DIAGNOSIS — J069 Acute upper respiratory infection, unspecified: Secondary | ICD-10-CM

## 2017-04-17 MED ORDER — METHYLPREDNISOLONE SODIUM SUCC 40 MG IJ SOLR
80.0000 mg | Freq: Once | INTRAMUSCULAR | Status: AC
  Administered 2017-04-17: 80 mg via INTRAMUSCULAR

## 2017-04-17 MED ORDER — PREDNISONE 20 MG PO TABS: ORAL_TABLET | ORAL | 0 refills | Status: DC

## 2017-04-17 MED ORDER — BENZONATATE 100 MG PO CAPS: 100.0000 mg | ORAL_CAPSULE | Freq: Three times a day (TID) | ORAL | 0 refills | Status: DC

## 2017-04-17 MED ORDER — CYCLOBENZAPRINE HCL 5 MG PO TABS: 5.0000 mg | ORAL_TABLET | Freq: Two times a day (BID) | ORAL | 0 refills | Status: DC | PRN

## 2017-04-17 NOTE — ED Provider Notes (Signed)
CSN: [id number]     Arrival date & time 04/17/17  [id number] History   First MD Initiated Contact with Patient 04/17/17 [phone]     Chief Complaint  Patient presents with  . Cough  . Gout   (Consider location/radiation/quality/duration/timing/severity/associated sxs/prior Treatment) HPI  Michael Duran is a 51 y.o. male presenting to UC with multiple complaints since returning from a trip from [place] last night around 6PM.    Left knee pain: He developed sudden onset Left knee pain around 9PM that feels c/w a prior gout flare. He is on nightly Uloric for his gout but he did take 2 tabs of left-over colchicine for his gout this morning with mild relief.  He notes he always does well with a shot of steroids and a steroid taper for his gout flares.  He does not believe his knee pain is due to arthritis, as the pain is sudden in onset, feels typical of his gout flares.  He notes the last few times the flares have occurred in his Left knee  Cough congestion: He developed cough and congestion last night, minimal relief with mucinex.  Denies fever, chills, n/v/d. Denies SOB. No hx of asthma. No known sick contacts but he did just get back from [place] last night.   Back pain: He is c/o exacerbation of bilateral mid back pain that is aching and sore. Occasional pain in Left upper abdomen/Left lower chest wall he is concerned may be from gastritis as he takes "a lot" of ibuprofen for his back and arthritis pain..      Past Medical History:  Diagnosis Date  . Chronic lower back pain   . Coronary artery disease   . Diabetes (HCC)    not since losing weight in 2006  . Gout   . HTN (hypertension)   . Hyperlipemia    mild  . Kidney stone    "passed it"  . Sleep apnea    not an issue with weight loss in 2006   Past Surgical History:  Procedure Laterality Date  . BACK SURGERY    . CARDIAC CATHETERIZATION  08/31/2014  . CORONARY ARTERY BYPASS GRAFT N/A 09/02/2014   Procedure: CORONARY ARTERY BYPASS  GRAFTING (CABG), ON PUMP, TIMES TWO, USING BILATERAL INTERNAL MAMMARY ARTERIES.;  Surgeon: Loreli SlotSteven C Hendrickson, MD;  Location: [facility] OR;  Service: Open Heart Surgery;  Laterality: N/A;  CABG w/ bilateral mammaries  . LEFT HEART CATHETERIZATION WITH CORONARY ANGIOGRAM N/A 08/31/2014   Procedure: LEFT HEART CATHETERIZATION WITH CORONARY ANGIOGRAM;  Surgeon: Lesleigh NoeHenry W Smith III, MD;  [facility];  Service: Cardiovascular;  Laterality: N/A;  . LUMBAR LAMINECTOMY  11/2003  . TEE WITHOUT CARDIOVERSION N/A 09/02/2014   Procedure: TRANSESOPHAGEAL ECHOCARDIOGRAM (TEE);  Surgeon: Loreli SlotSteven C Hendrickson, MD;  [facility];  Service: Open Heart Surgery;  Laterality: N/A;  . TONSILLECTOMY  ~ 1970   Family History  Problem Relation Age of Onset  . CAD Mother 9269       CABG  . Heart failure Mother   . CAD Father 7160       AVR  . Heart attack Father   . Diabetes Brother   . Stroke Neg Hx    Social History  Substance Use Topics  . Smoking status: Never Smoker  . Smokeless tobacco: Never Used  . Alcohol use No    Review of Systems  Constitutional: Negative for chills and fever.  HENT: Positive for congestion. Negative for ear pain, sore throat, trouble  swallowing and voice change.   Respiratory: Positive for cough. Negative for shortness of breath.   Cardiovascular: Positive for chest pain (Left lower ribs/lower chest). Negative for palpitations.  Gastrointestinal: Positive for abdominal pain (LUQ). Negative for diarrhea, nausea and vomiting.  Musculoskeletal: Positive for arthralgias, back pain and myalgias. Negative for gait problem, neck pain and neck stiffness.  Skin: Negative for rash.  Neurological: Negative for weakness and numbness.    Allergies  Patient has no known allergies.  Home Medications   Prior to Admission medications   Medication Sig Start Date End Date Taking? Authorizing Provider  amLODipine (NORVASC) 10 MG tablet TAKE 1 TABLET (10 MG TOTAL) BY MOUTH DAILY. 07/22/16    Rollene Rotunda, MD  aspirin 81 MG tablet Take 81 mg by mouth daily.    [provider]  atorvastatin (LIPITOR) 80 MG tablet TAKE ONE TABLET BY MOUTH ONE TIME DAILY 02/24/17   Rollene Rotunda, MD  atorvastatin (LIPITOR) 80 MG tablet Take 1 tablet (80 mg total) by mouth daily. 03/12/17   Rollene Rotunda, MD  benzonatate (TESSALON) 100 MG capsule Take 1-2 capsules (100-200 mg total) by mouth every 8 (eight) hours. 04/17/17   Junius Finner, PA-C  cyclobenzaprine (FLEXERIL) 5 MG tablet Take 1-2 tablets (5-10 mg total) by mouth 2 (two) times daily as needed for muscle spasms. 04/17/17   Junius Finner, PA-C  febuxostat (ULORIC) 40 MG tablet Take 40 mg by mouth daily.    [provider]  Fish Oil-Cholecalciferol (FISH OIL + D3 PO) Take by mouth.    [provider]  losartan (COZAAR) 100 MG tablet TAKE 1 TABLET (100 MG TOTAL) BY MOUTH DAILY. 07/22/16   Rollene Rotunda, MD  metoprolol succinate (TOPROL-XL) 25 MG 24 hr tablet Take 1 tablet (25 mg total) by mouth daily. 03/14/17   Rollene Rotunda, MD  predniSONE (DELTASONE) 20 MG tablet 3 tabs po day one, then 2 po daily x 4 days 04/17/17   Junius Finner, PA-C   Meds Ordered and Administered this Visit   Medications  methylPREDNISolone sodium succinate (SOLU-MEDROL) 40 mg/mL injection 80 mg (80 mg Intramuscular Given 04/17/17 1027)    BP 124/85 (BP Location: Left Arm)   Pulse 75   Temp 98.1 F (36.7 C) (Oral)   Wt 234 lb (106.1 kg)   SpO2 98%   BMI 33.58 kg/m  No data found.   Physical Exam  Constitutional: He is oriented to person, place, and time. He appears well-developed and well-nourished. No distress.  HENT:  Head: Normocephalic and atraumatic.  Right Ear: Tympanic membrane normal.  Left Ear: Tympanic membrane normal.  Nose: Nose normal.  Mouth/Throat: Uvula is midline, oropharynx is clear and moist and mucous membranes are normal.  Eyes: EOM are normal.  Neck: Normal range of motion. Neck supple.   Cardiovascular: Normal rate and regular rhythm.   Pulmonary/Chest: Effort normal and breath sounds normal. No respiratory distress. He has no wheezes. He has no rales. He exhibits no tenderness.  Abdominal: Soft. He exhibits no distension and no mass. There is no tenderness. There is no rebound and no guarding.  Musculoskeletal: Normal range of motion. He exhibits tenderness. He exhibits no edema.  No midline spinal tenderness. Tenderness to bilateral thoracic paraspinal muscles.  Left knee: no obvious edema. Tenderness to medial aspect. Full ROM. No crepitus.  Neurological: He is alert and oriented to person, place, and time.  Skin: Skin is warm and dry. He is not diaphoretic. No erythema.  Left knee: skin in  tact. No erythema or warmth.  Psychiatric: He has a normal mood and affect. His behavior is normal.  Nursing note and vitals reviewed.   Urgent Care Course     Procedures (including critical care time)  Labs Review Labs Reviewed - No data to display  Imaging Review Dg Chest 2 View  Result Date: 04/17/2017 CLINICAL DATA:  BILATERAL upper back pain.  Congestion. EXAM: CHEST  2 VIEW COMPARISON:  12/29/2015. FINDINGS: Cardiac silhouette within normal limits. Prior CABG. Mild vascular congestion. No active infiltrates or failure. No effusion or pneumothorax. Bones unremarkable. Similar appearance to priors. IMPRESSION: Normal heart size. Mild vascular congestion. No active infiltrates or failure. Electronically Signed   By: Elsie Stain M.D.   On: 04/17/2017 10:24   Dg Knee Complete 4 Views Left  Result Date: 04/17/2017 CLINICAL DATA:  51 year old male with a history of knee pain EXAM: LEFT KNEE - COMPLETE 4+ VIEW COMPARISON:  None. FINDINGS: No acute displaced fracture. No focal soft tissue swelling. No radiopaque foreign body. No joint effusion. IMPRESSION: Negative for acute bony abnormality. Electronically Signed   By: Gilmer Mor D.O.   On: 04/17/2017 10:25     MDM   1.  Viral URI with cough   2. Left knee pain   3. Acute idiopathic gout, unspecified site   4. Acute bilateral thoracic back pain   5. Heart burn    Pt with multiple concerns today. Left knee pain c/w gout flare, Plain films- unremarkable Cough, congestion most c/w viral illness. Back pain- muscular in nature.  Encouraged symptomatic treatment at this time.  Rx: Flexeril, prednisone, and tessalon May try OTC Prilosec for intermittent heart burn   F/u with PCP next week for recheck of symptoms     Junius Finner, PA-C 04/17/17 1105

## 2017-04-17 NOTE — Discharge Instructions (Signed)
° °  Flexeril (cyclobenzaprine) is a muscle relaxer and may cause drowsiness. Do not drink alcohol, drive, or operate heavy machinery while taking.  You were given a shot of solumedrol (a steroid) today to help with muscle pain and swelling.  You have been prescribed prednisone, an oral steroid.  You may start this medication tomorrow with breakfast.     You may try over the counter omeprazole such as Prilosec if you develop heart burn.  Take as indicated by the packaging.  If you symptoms are worsening or not improving, please follow up with your primary care provider for further evaluation.

## 2017-04-17 NOTE — ED Triage Notes (Signed)
Patient reports returning home from [place] last night @ 6pm felt well. 9pm developed left knee pain/gout flare and cough and congestion with aches. Taken Mucinex otc and gout Rx. He also c/o several weeks of upper back pressure that is now more pronounced.

## 2017-04-20 ENCOUNTER — Telehealth: Payer: Self-pay | Admitting: Emergency Medicine

## 2017-04-20 NOTE — Telephone Encounter (Signed)
Pt states that he is doing ok and will f/u prn.  TMartin,CMA

## 2017-04-23 ENCOUNTER — Emergency Department
Admission: EM | Admit: 2017-04-23 | Discharge: 2017-04-23 | Disposition: A | Discharge status: Home / Self Care | Payer: Commercial Managed Care - PPO | Source: Home / Self Care | Attending: Family Medicine | Admitting: Family Medicine

## 2017-04-23 ENCOUNTER — Encounter: Payer: Self-pay | Admitting: *Deleted

## 2017-04-23 DIAGNOSIS — B9789 Other viral agents as the cause of diseases classified elsewhere: Secondary | ICD-10-CM | POA: Diagnosis not present

## 2017-04-23 DIAGNOSIS — J069 Acute upper respiratory infection, unspecified: Secondary | ICD-10-CM | POA: Diagnosis not present

## 2017-04-23 MED ORDER — GUAIFENESIN-CODEINE 100-10 MG/5ML PO SOLN: ORAL | 0 refills | Status: DC

## 2017-04-23 MED ORDER — DOXYCYCLINE HYCLATE 100 MG PO CAPS: 100.0000 mg | ORAL_CAPSULE | Freq: Two times a day (BID) | ORAL | 0 refills | Status: DC

## 2017-04-23 NOTE — ED Triage Notes (Signed)
Pt c/o productive cough x 6 days. Denies fever. He has taken Benzonatate without relief. He took Delsym with some relief.

## 2017-04-23 NOTE — Discharge Instructions (Signed)
Take plain guaifenesin (1200mg extended release tabs such as Mucinex) twice daily, with plenty of water, for cough and congestion.  Get adequate rest.   °May use Afrin nasal spray (or generic oxymetazoline) each morning for about 5 days and then discontinue.  Also recommend using saline nasal spray several times daily and saline nasal irrigation (AYR is a common brand).  Use Flonase nasal spray each morning after using Afrin nasal spray and saline nasal irrigation. °Try warm salt water gargles for sore throat.  °Stop all antihistamines for now, and other non-prescription cough/cold preparations. °  °Follow-up with family doctor if not improving about 7 to 10 days.  °

## 2017-04-23 NOTE — ED Provider Notes (Signed)
[facility]    CSN: [id number] Arrival date & time: 04/23/17  1358     History   Chief Complaint Chief Complaint  Patient presents with  . Cough    HPI Michael Duran is a 51 y.o. male.   Patient complains of persistent cough and tightness in his anterior chest, but no shortness of breath or pleuritic pain.  He had been evaluated here 6 days ago with typical cold-like symptoms including mild sore throat, sinus congestion, fatigue, and productive cough.  No fevers, chills, and sweats.  Chest X-ray at that time was negative for infiltrates, but "mild vascular congestion" was noted. He denies increased lower leg swelling, PND.   The history is provided by the patient.    Past Medical History:  Diagnosis Date  . Chronic lower back pain   . Coronary artery disease   . Diabetes (HCC)    not since losing weight in 2006  . Gout   . HTN (hypertension)   . Hyperlipemia    mild  . Kidney stone    "passed it"  . Sleep apnea    not an issue with weight loss in 2006    Patient Active Problem List   Diagnosis Date Noted  . CAD (coronary artery disease) 09/20/2014  . PAF (paroxysmal atrial fibrillation) (HCC) 09/06/2014  . S/P CABG x 2 09/02/2014  . DM2 (diabetes mellitus, type 2) (HCC) 08/31/2014  . HTN (hypertension) 08/31/2014  . HLD (hyperlipidemia) 08/31/2014  . Abnormal nuclear stress test 08/31/2014  . Angina effort (HCC) 08/31/2014  . Chest pain 08/18/2014  . Precordial pain 08/18/2014    Past Surgical History:  Procedure Laterality Date  . BACK SURGERY    . CARDIAC CATHETERIZATION  08/31/2014  . CORONARY ARTERY BYPASS GRAFT N/A 09/02/2014   Procedure: CORONARY ARTERY BYPASS GRAFTING (CABG), ON PUMP, TIMES TWO, USING BILATERAL INTERNAL MAMMARY ARTERIES.;  Surgeon: Loreli SlotSteven C Hendrickson, MD;  Location: [facility] OR;  Service: Open Heart Surgery;  Laterality: N/A;  CABG w/ bilateral mammaries  . LEFT HEART CATHETERIZATION WITH CORONARY ANGIOGRAM N/A 08/31/2014   Procedure: LEFT HEART CATHETERIZATION WITH CORONARY ANGIOGRAM;  Surgeon: Lesleigh NoeHenry W Smith III, MD;  [facility];  Service: Cardiovascular;  Laterality: N/A;  . LUMBAR LAMINECTOMY  11/2003  . TEE WITHOUT CARDIOVERSION N/A 09/02/2014   Procedure: TRANSESOPHAGEAL ECHOCARDIOGRAM (TEE);  Surgeon: Loreli SlotSteven C Hendrickson, MD;  [facility];  Service: Open Heart Surgery;  Laterality: N/A;  . TONSILLECTOMY  ~ 1970       Home Medications    Prior to Admission medications   Medication Sig Start Date End Date Taking? Authorizing Provider  amLODipine (NORVASC) 10 MG tablet TAKE 1 TABLET (10 MG TOTAL) BY MOUTH DAILY. 07/22/16   Rollene RotundaHochrein, James, MD  aspirin 81 MG tablet Take 81 mg by mouth daily.    [provider]  atorvastatin (LIPITOR) 80 MG tablet TAKE ONE TABLET BY MOUTH ONE TIME DAILY 02/24/17   Rollene RotundaHochrein, James, MD  atorvastatin (LIPITOR) 80 MG tablet Take 1 tablet (80 mg total) by mouth daily. 03/12/17   Rollene RotundaHochrein, James, MD  benzonatate (TESSALON) 100 MG capsule Take 1-2 capsules (100-200 mg total) by mouth every 8 (eight) hours. 04/17/17   Junius Finner'Malley, Erin, PA-C  cyclobenzaprine (FLEXERIL) 5 MG tablet Take 1-2 tablets (5-10 mg total) by mouth 2 (two) times daily as needed for muscle spasms. 04/17/17   Junius Finner'Malley, Erin, PA-C  doxycycline (VIBRAMYCIN) 100 MG capsule Take 1 capsule (100 mg total) by mouth  2 (two) times daily. Take with food. 04/23/17   Lattie Haw, MD  febuxostat (ULORIC) 40 MG tablet Take 40 mg by mouth daily.    [provider]  Fish Oil-Cholecalciferol (FISH OIL + D3 PO) Take by mouth.    [provider]  guaiFENesin-codeine 100-10 MG/5ML syrup Take 10mL by mouth at bedtime as needed for cough 04/23/17   Lattie Haw, MD  losartan (COZAAR) 100 MG tablet TAKE 1 TABLET (100 MG TOTAL) BY MOUTH DAILY. 07/22/16   Rollene Rotunda, MD  metoprolol succinate (TOPROL-XL) 25 MG 24 hr tablet Take 1 tablet (25 mg total) by mouth daily. 03/14/17   Rollene Rotunda, MD    predniSONE (DELTASONE) 20 MG tablet 3 tabs po day one, then 2 po daily x 4 days 04/17/17   Junius Finner, PA-C    Family History Family History  Problem Relation Age of Onset  . CAD Mother 31       CABG  . Heart failure Mother   . CAD Father 15       AVR  . Heart attack Father   . Diabetes Brother   . Stroke Neg Hx     Social History Social History  Substance Use Topics  . Smoking status: Never Smoker  . Smokeless tobacco: Never Used  . Alcohol use No     Allergies   Patient has no known allergies.   Review of Systems Review of Systems  + sore throat + cough No pleuritic pain No wheezing + nasal congestion + post-nasal drainage No sinus pain/pressure No itchy/red eyes No earache No hemoptysis No SOB No fever/chills No nausea No vomiting No abdominal pain No diarrhea No urinary symptoms No skin rash + fatigue No myalgias No headache Used OTC meds without relief    Physical Exam Triage Vital Signs ED Triage Vitals  Enc Vitals Group     BP 04/23/17 1433 119/79     Pulse Rate 04/23/17 1433 69     Resp 04/23/17 1433 16     Temp 04/23/17 1433 98.6 F (37 C)     Temp Source 04/23/17 1433 Oral     SpO2 04/23/17 1433 97 %     Weight 04/23/17 1434 231 lb (104.8 kg)     Height 04/23/17 1434 5\' 10"  (1.778 m)     Head Circumference --      Peak Flow --      Pain Score 04/23/17 1434 0     Pain Loc --      Pain Edu? --      Excl. in GC? --    No data found.   Updated Vital Signs BP 119/79 (BP Location: Left Arm)   Pulse 69   Temp 98.6 F (37 C) (Oral)   Resp 16   Ht 5\' 10"  (1.778 m)   Wt 231 lb (104.8 kg)   SpO2 97%   BMI 33.15 kg/m   Visual Acuity Right Eye Distance:   Left Eye Distance:   Bilateral Distance:    Right Eye Near:   Left Eye Near:    Bilateral Near:     Physical Exam Nursing notes and Vital Signs reviewed. Appearance:  Patient appears stated age, and in no acute distress Eyes:  Pupils are equal, round, and  reactive to light and accomodation.  Extraocular movement is intact.  Conjunctivae are not inflamed  Ears:  Canals normal.  Tympanic membranes normal.  Nose:  Mildly congested turbinates.  No sinus tenderness.  Pharynx:  Normal Neck:  Supple.  Tender enlarged posterior/lateral nodes are palpated bilaterally  Lungs:  Clear to auscultation.  Breath sounds are equal.  Moving air well. Heart:  Regular rate and rhythm without murmurs, rubs, or gallops.  Abdomen:  Nontender without masses or hepatosplenomegaly.  Bowel sounds are present.  No CVA or flank tenderness.  Extremities:  No edema.  Skin:  No rash present.    UC Treatments / Results  Labs (all labs ordered are listed, but only abnormal results are displayed) Labs Reviewed  BRAIN NATRIURETIC PEPTIDE    EKG  EKG Interpretation None       Radiology No results found.  Procedures Procedures (including critical care time)  Medications Ordered in UC Medications - No data to display   Initial Impression / Assessment and Plan / UC Course  I have reviewed the triage vital signs and the nursing notes.  Pertinent labs & imaging results that were available during my care of the patient were reviewed by me and considered in my medical decision making (see chart for details).    Note "mild vascular congestion" on previous chest X-ray; and gradual increase in weight (218 pounds 12/29/15; 234 pounds 04/17/17).  Check BNP Begin empiric doxycycline for atypical coverage. Rx for Robitussin AC for night time cough.  Take plain guaifenesin (1200mg  extended release tabs such as Mucinex) twice daily, with plenty of water, for cough and congestion.  Get adequate rest.   May use Afrin nasal spray (or generic oxymetazoline) each morning for about 5 days and then discontinue.  Also recommend using saline nasal spray several times daily and saline nasal irrigation (AYR is a common brand).  Use Flonase nasal spray each morning after using Afrin nasal  spray and saline nasal irrigation. Try warm salt water gargles for sore throat.  Stop all antihistamines for now, and other non-prescription cough/cold preparations.  Follow-up with family doctor if not improving about 7 to 10 days.     Final Clinical Impressions(s) / UC Diagnoses   Final diagnoses:  Viral URI with cough    New Prescriptions New Prescriptions   DOXYCYCLINE (VIBRAMYCIN) 100 MG CAPSULE    Take 1 capsule (100 mg total) by mouth 2 (two) times daily. Take with food.   GUAIFENESIN-CODEINE 100-10 MG/5ML SYRUP    Take 10mL by mouth at bedtime as needed for cough     Lattie Haw, MD 04/28/17 [phone]

## 2017-04-24 ENCOUNTER — Telehealth: Payer: Self-pay | Admitting: *Deleted

## 2017-04-24 LAB — BRAIN NATRIURETIC PEPTIDE: BRAIN NATRIURETIC PEPTIDE: 37.8 pg/mL (ref <100)

## 2017-04-24 NOTE — Telephone Encounter (Signed)
Spoke to pt given lab results.  

## 2017-04-30 ENCOUNTER — Other Ambulatory Visit: Payer: Self-pay | Admitting: *Deleted

## 2017-04-30 MED ORDER — METOPROLOL SUCCINATE ER 25 MG PO TB24: 25.0000 mg | ORAL_TABLET | Freq: Every day | ORAL | 0 refills | Status: DC

## 2017-05-08 NOTE — Progress Notes (Signed)
HPI The patient for followup after CABG.  At the last visit he was having some vague chest pain and I had planned a POET (Plain Old Exercise Treadmill).  However, he did not want to have this done.  Since then he says that he is not having symptoms.  The patient denies any new symptoms such as chest discomfort, neck or arm discomfort. There has been no new shortness of breath, PND or orthopnea. There have been no reported palpitations, presyncope or syncope.  He is starting to work out at [employer]   No Known Allergies  Current Outpatient Prescriptions  Medication Sig Dispense Refill  . amLODipine (NORVASC) 10 MG tablet TAKE 1 TABLET (10 MG TOTAL) BY MOUTH DAILY. 90 tablet 2  . aspirin 81 MG tablet Take 81 mg by mouth daily.    Marland Kitchen atorvastatin (LIPITOR) 80 MG tablet Take 1 tablet (80 mg total) by mouth daily. 90 tablet 1  . febuxostat (ULORIC) 40 MG tablet Take 40 mg by mouth daily.    . Fish Oil-Cholecalciferol (FISH OIL + D3 PO) Take by mouth.    . losartan (COZAAR) 100 MG tablet TAKE 1 TABLET (100 MG TOTAL) BY MOUTH DAILY. 90 tablet 2  . metoprolol succinate (TOPROL-XL) 25 MG 24 hr tablet Take 1 tablet (25 mg total) by mouth daily. 90 tablet 0  . sildenafil (VIAGRA) 50 MG tablet Take 1 tablet (50 mg total) by mouth daily as needed for erectile dysfunction. 10 tablet 3   No current facility-administered medications for this visit.     Past Medical History:  Diagnosis Date  . Chronic lower back pain   . Coronary artery disease   . Diabetes (HCC)    not since losing weight in 2006  . Gout   . HTN (hypertension)   . Hyperlipemia    mild  . Kidney stone    "passed it"  . Sleep apnea    not an issue with weight loss in 2006    Past Surgical History:  Procedure Laterality Date  . BACK SURGERY    . CARDIAC CATHETERIZATION  08/31/2014  . CORONARY ARTERY BYPASS GRAFT N/A 09/02/2014   Procedure: CORONARY ARTERY BYPASS GRAFTING (CABG), ON PUMP, TIMES TWO, USING BILATERAL INTERNAL  MAMMARY ARTERIES.;  Surgeon: Loreli Slot, MD;  Location: [facility] OR;  Service: Open Heart Surgery;  Laterality: N/A;  CABG w/ bilateral mammaries  . LEFT HEART CATHETERIZATION WITH CORONARY ANGIOGRAM N/A 08/31/2014   Procedure: LEFT HEART CATHETERIZATION WITH CORONARY ANGIOGRAM;  Surgeon: Lesleigh Noe, MD;  Location: [facility] LAB;  Service: Cardiovascular;  Laterality: N/A;  . LUMBAR LAMINECTOMY  11/2003  . TEE WITHOUT CARDIOVERSION N/A 09/02/2014   Procedure: TRANSESOPHAGEAL ECHOCARDIOGRAM (TEE);  Surgeon: Loreli Slot, MD;  [facility];  Service: Open Heart Surgery;  Laterality: N/A;  . TONSILLECTOMY  ~ 1970    ROS:  ED. Otherwise as stated in the HPI and negative for all other systems.  PHYSICAL EXAM BP 126/82   Pulse 74   Ht 5\' 10"  (1.778 m)   Wt 229 lb (103.9 kg)   BMI 32.86 kg/m   GENERAL:  Well appearing NECK:  No jugular venous distention, waveform within normal limits, carotid upstroke brisk and symmetric, no bruits, no thyromegaly LUNGS:  Clear to auscultation bilaterally CHEST:  Unremarkable HEART:  PMI not displaced or sustained,S1 and S2 within normal limits, no S3, no S4, no clicks, no rubs, no murmurs ABD:  Flat, positive bowel sounds  normal in frequency in pitch, no bruits, no rebound, no guarding, no midline pulsatile mass, no hepatomegaly, no splenomegaly EXT:  2 plus pulses throughout, no edema.     ASSESSMENT AND PLAN   CAD:   The patient has no new sypmtoms.  No further cardiovascular testing is indicated.  We will continue with aggressive risk reduction and meds as listed.  DYSLIPIDEMIA:  His LDL was 95 with HDL 40.  He wants to try diet to get to goal.  He should have this repeated about four months after starting a better diet and exercise with a goal LDL less than 70.  HTN:  The blood pressure is at target. No change in medications is indicated. We will continue with therapeutic lifestyle changes (TLC).  OBESITY:  He commits to losing  weight with diet and exercise.  ED:  I gave him a prescription for Viagra.

## 2017-05-09 ENCOUNTER — Ambulatory Visit (INDEPENDENT_AMBULATORY_CARE_PROVIDER_SITE_OTHER): Payer: Commercial Managed Care - PPO | Admitting: Cardiology

## 2017-05-09 ENCOUNTER — Other Ambulatory Visit: Payer: Self-pay | Admitting: Cardiology

## 2017-05-09 ENCOUNTER — Encounter: Payer: Self-pay | Admitting: Cardiology

## 2017-05-09 VITALS — BP 126/82 | HR 74 | Ht 70.0 in | Wt 229.0 lb

## 2017-05-09 DIAGNOSIS — I2510 Atherosclerotic heart disease of native coronary artery without angina pectoris: Secondary | ICD-10-CM

## 2017-05-09 MED ORDER — SILDENAFIL CITRATE 50 MG PO TABS: 50.0000 mg | ORAL_TABLET | Freq: Every day | ORAL | 3 refills | Status: DC | PRN

## 2017-05-09 NOTE — Telephone Encounter (Signed)
Send to Nya/Dr, Kelly ServicesHochrein

## 2017-05-09 NOTE — Telephone Encounter (Signed)
New message   Pt c/o medication issue:  1. Name of Medication: sildenafil (VIAGRA) 50 MG tablet  2. How are you currently taking this medication (dosage and times per day)? 50mg   3. Are you having a reaction (difficulty breathing--STAT)? no  4. What is your medication issue? Dosage needs to be changed at the pharmacy so that insurance will pay for it. Requests a call back

## 2017-05-09 NOTE — Patient Instructions (Addendum)

## 2017-05-13 ENCOUNTER — Emergency Department
Admission: EM | Admit: 2017-05-13 | Discharge: 2017-05-13 | Disposition: A | Discharge status: Home / Self Care | Payer: Commercial Managed Care - PPO | Source: Home / Self Care | Attending: Family Medicine | Admitting: Family Medicine

## 2017-05-13 ENCOUNTER — Encounter: Payer: Self-pay | Admitting: *Deleted

## 2017-05-13 DIAGNOSIS — L03811 Cellulitis of head [any part, except face]: Secondary | ICD-10-CM | POA: Diagnosis not present

## 2017-05-13 MED ORDER — VALACYCLOVIR HCL 1 G PO TABS: 1000.0000 mg | ORAL_TABLET | Freq: Three times a day (TID) | ORAL | 0 refills | Status: DC

## 2017-05-13 MED ORDER — CLINDAMYCIN HCL 300 MG PO CAPS: 300.0000 mg | ORAL_CAPSULE | Freq: Three times a day (TID) | ORAL | 0 refills | Status: AC

## 2017-05-13 NOTE — Discharge Instructions (Signed)
Apply warm compress two or three times daily.  May take Ibuprofen 200mg , 4 tabs every 8 hours with food if needed for pain

## 2017-05-13 NOTE — ED Provider Notes (Signed)
[facility]    CSN: [id number] Arrival date & time: 05/13/17  1814     History   Chief Complaint Chief Complaint  Patient presents with  . Abscess    HPI Michael Duran is a 51 y.o. male.   Patient complains of 2 day history of possible "abscess" on his right scalp behind his right ear.  The area is painful, swollen, and associated with a headache.  He feels well otherwise.   The history is provided by the patient.  Rash  Location: right scalp. Quality: dryness, painful, redness and swelling   Quality: not blistering, not draining, not itchy and not weeping   Pain details:    Quality:  Aching   Severity:  Mild   Onset quality:  Sudden   Duration:  2 days   Timing:  Constant   Progression:  Worsening Severity:  Moderate Onset quality:  Sudden Duration:  2 days Timing:  Constant Progression:  Worsening Chronicity:  New Context: not exposure to similar rash, not insect bite/sting and not plant contact   Relieved by:  Nothing Worsened by:  Contact Ineffective treatments:  OTC analgesics Associated symptoms: headaches   Associated symptoms: no diarrhea, no fatigue, no fever, no induration, no joint pain, no myalgias and no nausea     Past Medical History:  Diagnosis Date  . Chronic lower back pain   . Coronary artery disease   . Diabetes (HCC)    not since losing weight in 2006  . Gout   . HTN (hypertension)   . Hyperlipemia    mild  . Kidney stone    "passed it"  . Sleep apnea    not an issue with weight loss in 2006    Patient Active Problem List   Diagnosis Date Noted  . CAD (coronary artery disease) 09/20/2014  . PAF (paroxysmal atrial fibrillation) (HCC) 09/06/2014  . S/P CABG x 2 09/02/2014  . DM2 (diabetes mellitus, type 2) (HCC) 08/31/2014  . HTN (hypertension) 08/31/2014  . HLD (hyperlipidemia) 08/31/2014  . Abnormal nuclear stress test 08/31/2014  . Angina effort (HCC) 08/31/2014  . Chest pain 08/18/2014  . Precordial pain  08/18/2014    Past Surgical History:  Procedure Laterality Date  . BACK SURGERY    . CARDIAC CATHETERIZATION  08/31/2014  . CORONARY ARTERY BYPASS GRAFT N/A 09/02/2014   Procedure: CORONARY ARTERY BYPASS GRAFTING (CABG), ON PUMP, TIMES TWO, USING BILATERAL INTERNAL MAMMARY ARTERIES.;  Surgeon: Loreli SlotSteven C Hendrickson, MD;  Location: [facility] OR;  Service: Open Heart Surgery;  Laterality: N/A;  CABG w/ bilateral mammaries  . LEFT HEART CATHETERIZATION WITH CORONARY ANGIOGRAM N/A 08/31/2014   Procedure: LEFT HEART CATHETERIZATION WITH CORONARY ANGIOGRAM;  Surgeon: Lesleigh NoeHenry W Smith III, MD;  [facility];  Service: Cardiovascular;  Laterality: N/A;  . LUMBAR LAMINECTOMY  11/2003  . TEE WITHOUT CARDIOVERSION N/A 09/02/2014   Procedure: TRANSESOPHAGEAL ECHOCARDIOGRAM (TEE);  Surgeon: Loreli SlotSteven C Hendrickson, MD;  [facility];  Service: Open Heart Surgery;  Laterality: N/A;  . TONSILLECTOMY  ~ 1970       Home Medications    Prior to Admission medications   Medication Sig Start Date End Date Taking? Authorizing Provider  amLODipine (NORVASC) 10 MG tablet TAKE 1 TABLET (10 MG TOTAL) BY MOUTH DAILY. 07/22/16   Rollene RotundaHochrein, James, MD  aspirin 81 MG tablet Take 81 mg by mouth daily.    [provider]  atorvastatin (LIPITOR) 80 MG tablet Take 1 tablet (80 mg total)  by mouth daily. 03/12/17   Rollene Rotunda, MD  clindamycin (CLEOCIN) 300 MG capsule Take 1 capsule (300 mg total) by mouth 3 (three) times daily. 05/13/17 05/23/17  Lattie Haw, MD  febuxostat (ULORIC) 40 MG tablet Take 40 mg by mouth daily.    [provider]  Fish Oil-Cholecalciferol (FISH OIL + D3 PO) Take by mouth.    [provider]  losartan (COZAAR) 100 MG tablet TAKE 1 TABLET (100 MG TOTAL) BY MOUTH DAILY. 07/22/16   Rollene Rotunda, MD  metoprolol succinate (TOPROL-XL) 25 MG 24 hr tablet Take 1 tablet (25 mg total) by mouth daily. 04/30/17   Rollene Rotunda, MD  sildenafil (VIAGRA) 50 MG tablet Take 1 tablet  (50 mg total) by mouth daily as needed for erectile dysfunction. 05/09/17   Rollene Rotunda, MD  valACYclovir (VALTREX) 1000 MG tablet Take 1 tablet (1,000 mg total) by mouth 3 (three) times daily. 05/13/17   Lattie Haw, MD    Family History Family History  Problem Relation Age of Onset  . CAD Mother 87       CABG  . Heart failure Mother   . CAD Father 45       AVR  . Heart attack Father   . Diabetes Brother   . Stroke Neg Hx     Social History Social History  Substance Use Topics  . Smoking status: Never Smoker  . Smokeless tobacco: Never Used  . Alcohol use No     Allergies   Patient has no known allergies.   Review of Systems Review of Systems  Constitutional: Negative for fatigue and fever.  Gastrointestinal: Negative for diarrhea and nausea.  Musculoskeletal: Negative for arthralgias and myalgias.  Skin: Positive for rash.  Neurological: Positive for headaches.  All other systems reviewed and are negative.    Physical Exam Triage Vital Signs ED Triage Vitals  Enc Vitals Group     BP 05/13/17 1833 130/86     Pulse Rate 05/13/17 1833 64     Resp 05/13/17 1833 16     Temp 05/13/17 1833 97.8 F (36.6 C)     Temp Source 05/13/17 1833 Oral     SpO2 05/13/17 1833 94 %     Weight 05/13/17 1834 224 lb (101.6 kg)     Height 05/13/17 1834 5\' 10"  (1.778 m)     Head Circumference --      Peak Flow --      Pain Score 05/13/17 1834 9     Pain Loc --      Pain Edu? --      Excl. in GC? --    No data found.   Updated Vital Signs BP 130/86 (BP Location: Left Arm)   Pulse 64   Temp 97.8 F (36.6 C) (Oral)   Resp 16   Ht 5\' 10"  (1.778 m)   Wt 224 lb (101.6 kg)   SpO2 94%   BMI 32.14 kg/m   Visual Acuity Right Eye Distance:   Left Eye Distance:   Bilateral Distance:    Right Eye Near:   Left Eye Near:    Bilateral Near:     Physical Exam  Constitutional: He appears well-developed and well-nourished. No distress.  HENT:  Head:    Right  Ear: External ear normal.  Left Ear: External ear normal.  Nose: Nose normal.  Mouth/Throat: Oropharynx is clear and moist.  Erythematous vesicular lesion right occipital area with swelling and surrounding tenderness to palpation.  No induration or fluctuance.  Eyes: Conjunctivae and EOM are normal. Pupils are equal, round, and reactive to light.  Neck: Neck supple.  Cardiovascular: Normal heart sounds.   Pulmonary/Chest: Breath sounds normal.  Lymphadenopathy:    He has no cervical adenopathy.  Neurological: He is alert.  Skin: Skin is warm and dry.  Nursing note and vitals reviewed.    UC Treatments / Results  Labs (all labs ordered are listed, but only abnormal results are displayed) Labs Reviewed  VIRAL CULTURE VIRC  WOUND CULTURE    EKG  EKG Interpretation None       Radiology No results found.  Procedures Procedures (including critical care time)  Medications Ordered in UC Medications - No data to display   Initial Impression / Assessment and Plan / UC Course  I have reviewed the triage vital signs and the nursing notes.  Pertinent labs & imaging results that were available during my care of the patient were reviewed by me and considered in my medical decision making (see chart for details).    Probably cellulitis, but concern also for herpes zoster. Wound culture pending.  Begin Clindamycin 300mg  TID for staph coverage. Will send viral culture also (Begin Valtrex if positive for Varicella Zoster). Apply warm compress two or three times daily.  May take Ibuprofen 200mg , 4 tabs every 8 hours with food if needed for pain Followup with Family Doctor if not improved in one week.     Final Clinical Impressions(s) / UC Diagnoses   Final diagnoses:  Cellulitis of scalp    New Prescriptions New Prescriptions   CLINDAMYCIN (CLEOCIN) 300 MG CAPSULE    Take 1 capsule (300 mg total) by mouth 3 (three) times daily.   VALACYCLOVIR (VALTREX) 1000 MG TABLET     Take 1 tablet (1,000 mg total) by mouth 3 (three) times daily.     Lattie Haw, MD 05/17/17 2244

## 2017-05-13 NOTE — ED Triage Notes (Signed)
Pt c/o abscess behind his RT ear x 2 days with HA. Last dose tylenol at 1:30pm today.

## 2017-05-13 NOTE — Telephone Encounter (Signed)
Follow Up   Pt calling back to follow up on medication. States he still hasn't heard anything back. Requesting call back from nurse

## 2017-05-14 MED ORDER — SILDENAFIL CITRATE 20 MG PO TABS: 20.0000 mg | ORAL_TABLET | Freq: Three times a day (TID) | ORAL | 3 refills | Status: DC

## 2017-05-14 MED ORDER — SILDENAFIL CITRATE 20 MG PO TABS: 40.0000 mg | ORAL_TABLET | Freq: Every day | ORAL | 3 refills | Status: DC | PRN

## 2017-05-14 NOTE — Telephone Encounter (Signed)
Left message for patient to call and let us know what pharmacy to send prescription to.

## 2017-05-14 NOTE — Telephone Encounter (Signed)
Returned call to patient. He has a coupon for Sildenifil 20 mg tablets. Medication ordered and medication list updated. [facility] [place].

## 2017-05-14 NOTE — Telephone Encounter (Signed)
Follow up  ° °Patient returning call back to nurse from today .  °

## 2017-05-16 LAB — WOUND CULTURE
GRAM STAIN: NONE SEEN
GRAM STAIN: NONE SEEN
Gram Stain: NONE SEEN
Organism ID, Bacteria: NORMAL

## 2017-05-18 LAB — RFX HSV/VARICELLA ZOSTER RAPID CULT

## 2017-05-18 LAB — REFLEX ADENOVIRUS CULTURE

## 2017-05-20 LAB — CYTOMEGALOVIRUS CULTURE

## 2017-05-20 LAB — VIRAL CULTURE VIRC

## 2017-05-21 ENCOUNTER — Telehealth: Payer: Self-pay | Admitting: Emergency Medicine

## 2017-05-21 NOTE — Telephone Encounter (Signed)
Culture neg

## 2017-07-21 ENCOUNTER — Encounter: Payer: Self-pay | Admitting: Sports Medicine

## 2017-07-21 ENCOUNTER — Ambulatory Visit (INDEPENDENT_AMBULATORY_CARE_PROVIDER_SITE_OTHER): Payer: Commercial Managed Care - PPO | Admitting: Sports Medicine

## 2017-07-21 DIAGNOSIS — M47816 Spondylosis without myelopathy or radiculopathy, lumbar region: Secondary | ICD-10-CM | POA: Insufficient documentation

## 2017-07-21 MED ORDER — MELOXICAM 15 MG PO TABS: ORAL_TABLET | ORAL | 3 refills | Status: DC

## 2017-07-21 NOTE — Progress Notes (Signed)

## 2017-07-21 NOTE — Assessment & Plan Note (Signed)
Custom orthotics as above per patient request. Leg lengths were only 1 cm different  He does have a history of an L5-S1 what sounds to be a microdiscectomy for radiculopathy in the distant past. Still has back pain that's worse with standing, better with sitting in flexion. This is consistent with facet mediated pain versus lumbar spinal stenosis. Has had several epidurals without much improvement. He is going to get his MRI disc for me. He will do physical therapy and with switching to meloxicam. Return to see me in a month, we will discuss facet joint injections if no better.

## 2017-07-30 ENCOUNTER — Other Ambulatory Visit: Payer: Self-pay | Admitting: Cardiology

## 2017-08-01 ENCOUNTER — Ambulatory Visit: Payer: Commercial Managed Care - PPO | Admitting: Rehabilitative and Restorative Service Providers"

## 2017-08-11 ENCOUNTER — Ambulatory Visit: Payer: Commercial Managed Care - PPO | Admitting: Rehabilitative and Restorative Service Providers"

## 2017-08-18 ENCOUNTER — Ambulatory Visit (INDEPENDENT_AMBULATORY_CARE_PROVIDER_SITE_OTHER): Payer: Commercial Managed Care - PPO | Admitting: Rehabilitative and Restorative Service Providers"

## 2017-08-18 ENCOUNTER — Encounter: Payer: Self-pay | Admitting: Rehabilitative and Restorative Service Providers"

## 2017-08-18 DIAGNOSIS — M6281 Muscle weakness (generalized): Secondary | ICD-10-CM

## 2017-08-18 DIAGNOSIS — G8929 Other chronic pain: Secondary | ICD-10-CM | POA: Diagnosis not present

## 2017-08-18 DIAGNOSIS — M545 Low back pain: Secondary | ICD-10-CM | POA: Diagnosis not present

## 2017-08-18 DIAGNOSIS — R29898 Other symptoms and signs involving the musculoskeletal system: Secondary | ICD-10-CM | POA: Diagnosis not present

## 2017-08-18 NOTE — Patient Instructions (Signed)
Back extension: Using Arms    Lying face down with arms bent, inhale. Then while exhaling, straighten arms. Hold _2-3___ seconds. Relax buttocks and legs. Slowly return to starting position. Repeat __10__ times per set.  Do __2__ sessions per day.  On Elbows (Prone)    Rise up on elbows as high as possible, keeping hips on floor. Hold __up to 5 minutes. . Do __2__ sessions per day.  Supine: Leg Stretch With Strap (Basic)    Lie on back with one knee bent, foot flat on floor. Hook strap around other foot. Straighten knee. Keep knee level with other knee. Hold _30__ seconds. Relax leg completely down to floor.  Repeat __3_ times per session. Do _2__ sessions per day.  Piriformis Stretch - Supine    Place Left foot next to Right knee. With strap around Left knee, use right hand to pull knee across body.  "Pie slices".  Pull uninvolved knee across body toward opposite shoulder. Hold slight stretch for _30__ seconds. Repeat with involved leg. Repeat _3__ times. Do __2_ times per day.  3 part core exercise    With neutral spine, tighten pelvic floor, then tighten abdominal muscles sucking your belly button to back bone, then tighten muscles in low back at waist. Hold 10 seconds. Repeat _10_ times. Do _several__ times a day.  * When you have mastered this, you can contract all 3 muscle groups at same time.   * Progress to do this activity sitting, standing, walking and functional activities.     **Self massage with ball to affected area for 3-5 min. (not to the bony area, just the muscles)    Sleeping on Back  Place pillow under knees. A pillow with cervical support and a roll around waist are also helpful. Copyright  VHI. All rights reserved.  Sleeping on Side Place pillow between knees. Use cervical support under neck and a roll around waist as needed. Copyright  VHI. All rights reserved.   Sleeping on Stomach   If this is the only desirable sleeping position, place  pillow under lower legs, and under stomach or chest as needed.  Posture - Sitting   Sit upright, head facing forward. Try using a roll to support lower back. Keep shoulders relaxed, and avoid rounded back. Keep hips level with knees. Avoid crossing legs for long periods. Stand to Sit / Sit to Stand   To sit: Bend knees to lower self onto front edge of chair, then scoot back on seat. To stand: Reverse sequence by placing one foot forward, and scoot to front of seat. Use rocking motion to stand up.   Work Height and Reach  Ideal work height is no more than 2 to 4 inches below elbow level when standing, and at elbow level when sitting. Reaching should be limited to arm's length, with elbows slightly bent.  Bending  Bend at hips and knees, not back. Keep feet shoulder-width apart.    Posture - Standing   Good posture is important. Avoid slouching and forward head thrust. Maintain curve in low back and align ears over shoul- ders, hips over ankles.  Alternating Positions   Alternate tasks and change positions frequently to reduce fatigue and muscle tension. Take rest breaks. Computer Work   Position work to Art gallery manager. Use proper work and seat height. Keep shoulders back and down, wrists straight, and elbows at right angles. Use chair that provides full back support. Add footrest and lumbar roll as needed.  Getting Into /  Out of Car  Lower self onto seat, scoot back, then bring in one leg at a time. Reverse sequence to get out.  Dressing  Lie on back to pull socks or slacks over feet, or sit and bend leg while keeping back straight.    Housework - Sink  Place one foot on ledge of cabinet under sink when standing at sink for prolonged periods.   Pushing / Pulling  Pushing is preferable to pulling. Keep back in proper alignment, and use leg muscles to do the work.  Deep Squat   Squat and lift with both arms held against upper trunk. Tighten stomach muscles without  holding breath. Use smooth movements to avoid jerking.  Avoid Twisting   Avoid twisting or bending back. Pivot around using foot movements, and bend at knees if needed when reaching for articles.  Carrying Luggage   Distribute weight evenly on both sides. Use a cart whenever possible. Do not twist trunk. Move body as a unit.   Lifting Principles .Maintain proper posture and head alignment. .Slide object as close as possible before lifting. .Move obstacles out of the way. .Test before lifting; ask for help if too heavy. .Tighten stomach muscles without holding breath. .Use smooth movements; do not jerk. .Use legs to do the work, and pivot with feet. .Distribute the work load symmetrically and close to the center of trunk. .Push instead of pull whenever possible.   Ask For Help   Ask for help and delegate to others when possible. Coordinate your movements when lifting together, and maintain the low back curve.  Log Roll   Lying on back, bend left knee and place left arm across chest. Roll all in one movement to the right. Reverse to roll to the left. Always move as one unit. Housework - Sweeping  Use long-handled equipment to avoid stooping.   Housework - Wiping  Position yourself as close as possible to reach work surface. Avoid straining your back.  Laundry - Unloading Wash   To unload small items at bottom of washer, lift leg opposite to arm being used to reach.  Gardening - Raking  Move close to area to be raked. Use arm movements to do the work. Keep back straight and avoid twisting.     Cart  When reaching into cart with one arm, lift opposite leg to keep back straight.   Getting Into / Out of Bed  Lower self to lie down on one side by raising legs and lowering head at the same time. Use arms to assist moving without twisting. Bend both knees to roll onto back if desired. To sit up, start from lying on side, and use same move-ments in reverse. Housework  - Vacuuming  Hold the vacuum with arm held at side. Step back and forth to move it, keeping head up. Avoid twisting.   Laundry - Armed forces training and education officer so that bending and twisting can be avoided.   Laundry - Unloading Dryer  Squat down to reach into clothes dryer or use a reacher.  Gardening - Weeding / Psychiatric nurse or Kneel. Knee pads may be helpful.

## 2017-08-18 NOTE — Therapy (Addendum)
[place], [place], [address] Phone: [phone]   Fax:  [phone]  Physical Therapy Evaluation  Patient Details  Name: Michael Duran MRN: [id number] Date of Birth: [birth year] Referring Provider: Dr Dianah Field  Encounter Date: 08/18/2017      PT End of Session - 08/18/17 0850    Visit Number 1   Number of Visits 6   Date for PT Re-Evaluation 09/29/17   PT Start Time 0846   PT Stop Time 0934   PT Time Calculation (min) 48 min   Activity Tolerance Patient tolerated treatment well      Past Medical History:  Diagnosis Date  . Chronic lower back pain   . Coronary artery disease   . Diabetes ([place])    not since losing weight in 2006  . Gout   . HTN (hypertension)   . Hyperlipemia    mild  . Kidney stone    "passed it"  . Sleep apnea    not an issue with weight loss in 2006    Past Surgical History:  Procedure Laterality Date  . BACK SURGERY    . CARDIAC CATHETERIZATION  08/31/2014  . CORONARY ARTERY BYPASS GRAFT N/A 09/02/2014   Procedure: CORONARY ARTERY BYPASS GRAFTING (CABG), ON PUMP, TIMES TWO, USING BILATERAL INTERNAL MAMMARY ARTERIES.;  Surgeon: Michael Nakayama, MD;  Location: [facility];  Service: Open Heart Surgery;  Laterality: N/A;  CABG w/ bilateral mammaries  . LEFT HEART CATHETERIZATION WITH CORONARY ANGIOGRAM N/A 08/31/2014   Procedure: LEFT HEART CATHETERIZATION WITH CORONARY ANGIOGRAM;  Surgeon: Michael Grooms, MD;  Location[facility] C[facility] LAB;  Service: Cardiovascular;  Laterality: N/A;  . LUMBAR LAMINECTOMY  11/2003  . TEE WITHOUT CARDIOVERSION N/A 09/02/2014   Procedure: TRANSESOPHAGEAL ECHOCARDIOGRAM (TEE);  Surgeon: Michael Nakayama, MD;  Location: [facility];  Service: Open Heart Surgery;  Laterality: N/A;  . TONSILLECTOMY  ~ 1970    There were no vitals filed for this visit.       Subjective Assessment - 08/18/17 0853    Subjective Patient reports that he was  here or Rt knee pain 4/17. He progressed well with knee rehab. He is here now for LBP into the Lt lumbar spine. He was fitted for shoe inserts. He wants to get a list of exercises for LB but does not want to continue with a regular course of therapy.    Pertinent History L5 disectomy 2005 with some increase in pain in the past 2 years. He has had epidural injections with minimal change; CABG 10/15; borderline AODM    Diagnostic tests xrays MRI   Patient Stated Goals learn exercises for home    Currently in Pain? No/denies   Pain Location Back   Pain Orientation Left;Lower   Pain Descriptors / Indicators Dull;Burning   Pain Type Chronic pain   Pain Radiating Towards Lt hip not into Lt LE    Pain Onset More than a month ago   Pain Frequency Intermittent   Aggravating Factors  back pain occurs toward the end of the day - does not matter what he does activity he does   Pain Relieving Factors better in the morning; massage at gym with water bed; meds;             [facility] PT Assessment - 08/18/17 0001      Assessment   Medical Diagnosis Lumbar spondylosis   Referring Provider Dr Dianah Field   Onset Date/Surgical Date 04/26/15  Hand Dominance Right   Next MD Visit 10/18   Prior Therapy yes for knee      Precautions   Precautions None     Balance Screen   Has the patient fallen in the past 6 months No   Has the patient had a decrease in activity level because of a fear of falling?  No   Is the patient reluctant to leave their home because of a fear of falling?  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    Leisure gym and walking 5-6 days/wk 2-3 miles; otherwise sedentary      Observation/Other Assessments   Focus on Therapeutic Outcomes (FOTO)  30% limitation      Sensation   Additional Comments WFL's per pt report      Posture/Postural Control   Posture  Comments fwd flexed in sitting; stands with LE's in ER; rounded shoudlers head forward      AROM   Overall AROM Comments tight bilat hips at end range - especially internal rotation      Strength   Overall Strength Comments 5/5 bilat LE's      Flexibility   Hamstrings tight 75 deg   Quadriceps tight bilat    ITB tight bilat    Piriformis tight Lt > Rt      Palpation   Spinal mobility hypomobile lumbar    Palpation comment tight lumbar paraspinals Lt piriformis/glut med/min             Objective measurements completed on examination: See above findings.          Michael Duran Adult PT Treatment/Exercise - 08/18/17 0001      Therapeutic Activites    Therapeutic Activities --  myofacial ball release work      Neuro Re-ed    Neuro Re-ed Details  initiated postural and body mechanics instruction      Lumbar Exercises: Stretches   Passive Hamstring Stretch 3 reps;30 seconds   Press Ups --  2-3 sec pause x 10    Prone Mid Back Stretch --  prone prop 2-3 min on elbows    Piriformis Stretch 3 reps;30 seconds  varying angles      Lumbar Exercises: Supine   Other Supine Lumbar Exercises 4 part core 10 sec x 10 - requires additional instruction                 PT Education - 08/18/17 0934    Comprehension Verbal cues required;Tactile cues required             PT Long Term Goals - 08/18/17 1128      PT LONG TERM GOAL #1   Title I with advanced HEP 09/29/17   Time 6   Period Weeks   Status New     PT LONG TERM GOAL #2   Title increase flexibility Lt hip - piriformis musculature to equal Rt 09/29/17   Time 6   Period Weeks     PT LONG TERM GOAL #3   Title report decrease in Lt hip pain by 50% at the end of work day/into evening 09/29/17   Time 6   Period Weeks   Status New     PT LONG TERM GOAL #4   Title improve FOTO to </= 27% limitation 09/29/17   Time 6   Period Weeks  Status New     PT LONG TERM GOAL #5   Title demonstrate improved  awareness of proper back care with correct sitting posture and alignment and improved standing alignment with posterior shoulder and core engaged 09/29/17   Time 6   Period Weeks   Status New                Plan - 08/18/17 8685    Clinical Impression Statement (P)  Michael Duran presents with c/o chronic LBP and Lt hip pain of > 2 years duration. Symtpoms are worse toward the end of the day and are not relieved with medication. Michael Duran presents with poor posture and alignment expecially in sitting; limited hip ROM and mobilty; hypomobility through lumbar spinel muscular tightness thorugh the Lt piriformis and hip abductors. He will benefit form PT to address problems identified.     Clinical Presentation (P)  Stable   Clinical Decision Making (P)  Low   Rehab Potential (P)  Good   PT Frequency (P)  1x / week   PT Duration (P)  6 weeks   PT Treatment/Interventions (P)  Patient/family education;ADLs/Self Care Home Management;Cryotherapy;Electrical Stimulation;Iontophoresis 69m/ml Dexamethasone;Moist Heat;Ultrasound;Dry needling;Manual techniques;Therapeutic activities;Therapeutic exercise;Neuromuscular re-education   PT Next Visit Plan (P)  Review HEP; progress with core stabilization; doorway stretch    Consulted and Agree with Plan of Care (P)  Patient      Patient will benefit from skilled therapeutic intervention in order to improve the following deficits and impairments:  (P) Postural dysfunction, Improper body mechanics, Pain, Decreased range of motion, Decreased mobility, Decreased activity tolerance  Visit Diagnosis: Other symptoms and signs involving the musculoskeletal system - Plan: PT plan of care cert/re-cert  Chronic left-sided low back pain without sciatica - Plan: PT plan of care cert/re-cert  Muscle weakness (generalized) - Plan: PT plan of care cert/re-cert     Problem List Patient Active Problem List   Diagnosis Date Noted  . Lumbar spondylosis 07/21/2017  . CAD  (coronary artery disease) 09/20/2014  . PAF ([place]oxysmal [place] fibrillation) [place] 09/06/2014  . S/P CABG x 2 09/02/2014  . DM2 (di[place] mellitus, type 2) [place] 08/31/2014  . HTN (hypertension) 08/31/2014  . HLD (hyperlipidemia) 08/31/2014  . Abnormal nuclear stress test 08/31/2014  . Angina effort ([place] 08/31/2014  . Chest pain 08/18/2014  . Precordial pain 08/18/2014    Michael Duran PNilda SimmerPT, MPH  08/18/2017, 11:35 AM  [facility] [address] [address]Phone: [phone]  Fax:  [phone] Name: Michael HIGHAMMRN: [id number] of Birth: 726-Jan-1967 PHYSICAL THERAPY DISCHARGE SUMMARY  Visits from Start of Care: Evaluation only  Current functional level related to goals / functional outcomes: See progress note for discharge status   Remaining deficits: Unchanged    Education / Equipment: HEP  Plan: Patient agrees to discharge.  Patient goals were not met. Patient is being discharged due to not returning since the last visit.  ?????     Michael Duran P. HHelene KelpPT, MPH 10/06/17 2:32 PM

## 2017-08-25 ENCOUNTER — Encounter: Payer: Self-pay | Admitting: Sports Medicine

## 2017-08-25 ENCOUNTER — Ambulatory Visit (INDEPENDENT_AMBULATORY_CARE_PROVIDER_SITE_OTHER): Payer: Commercial Managed Care - PPO | Admitting: Sports Medicine

## 2017-08-25 DIAGNOSIS — M47816 Spondylosis without myelopathy or radiculopathy, lumbar region: Secondary | ICD-10-CM | POA: Diagnosis not present

## 2017-08-25 NOTE — Progress Notes (Signed)
  Subjective:    CC: Follow-up  HPI: This is a pleasant 51 year old male, he has known multilevel lumbar degenerative disc disease, he is post right sided hemilaminectomy in the distant past. He was having right-sided radicular symptoms at that time. More recently he's been having left-sided pain, axial with burning into the left gluteal region. No bowel or bladder dysfunction, saddle numbness or constitutional symptoms. At this point he has been through formal physical therapy, we switched NSAIDs without any efficacy, he has had several epidurals without improvement. He is agreeable to discuss this again with Dr. Wynetta Emery.  Past medical history:  Negative.  See flowsheet/record as well for more information.  Surgical history: Negative.  See flowsheet/record as well for more information.  Family history: Negative.  See flowsheet/record as well for more information.  Social history: Negative.  See flowsheet/record as well for more information.  Allergies, and medications have been entered into the medical record, reviewed, and no changes needed.   Review of Systems: No fevers, chills, night sweats, weight loss, chest pain, or shortness of breath.   Objective:    General: Well Developed, well nourished, and in no acute distress.  Neuro: Alert and oriented x3, extra-ocular muscles intact, sensation grossly intact.  HEENT: Normocephalic, atraumatic, pupils equal round reactive to light, neck supple, no masses, no lymphadenopathy, thyroid nonpalpable.  Skin: Warm and dry, no rashes. Cardiac: Regular rate and rhythm, no murmurs rubs or gallops, no lower extremity edema.  Respiratory: Clear to auscultation bilaterally. Not using accessory muscles, speaking in full sentences.  Lumbar spine MRI from [facility] in 2016 shows L3-L4 desiccation with mild bulging, L4-L5 disc degeneration with a broad-based protrusion indenting the thecal sac, narrowing both lateral recesses, and at L5-S1 there is a  partial previous right hemilaminectomy with disc degeneration, and protruding disc material centrally and towards the left displacing the left S1 nerve root. There is also extraforaminal displacement of the left L5 nerve root at this level. Facet joints look okay.  Impression and Recommendations:    Lumbar spondylosis Has only had a couple of weeks of physical therapy but overall symptoms are slightly improved, switching NSAIDs did not change anything. He still has significant left-sided pain, has at this point failed physical therapy, steroids, NSAIDs, as well as several epidurals. Considering the size of his left-sided L5-S1 protruding disc think he would be a good candidate at this point for repeat Michael discectomy. He will touch base again with Dr. Wynetta Emery to discuss. His orthotics make his shoes a bit tight, I have advised him to try another set of shoes but half size larger.  I spent 25 minutes with this patient, greater than 50% was face-to-face time counseling regarding the above diagnoses ___________________________________________ [name]. [name], M.D., ABFM., CAQSM. Primary Care and Sports Medicine [facility]  Adjunct Instructor of Family Medicine  [employer]

## 2017-08-25 NOTE — Assessment & Plan Note (Addendum)
Has only had a couple of weeks of physical therapy but overall symptoms are slightly improved, switching NSAIDs did not change anything. He still has significant left-sided pain, has at this point failed physical therapy, steroids, NSAIDs, as well as several epidurals. Considering the size of his left-sided L5-S1 protruding disc think he would be a good candidate at this point for repeat Michael discectomy. He will touch base again with Dr. Wynetta Emery to discuss. His orthotics make his shoes a bit tight, I have advised him to try another set of shoes but half size larger.

## 2017-08-26 ENCOUNTER — Other Ambulatory Visit: Payer: Self-pay | Admitting: Cardiology

## 2017-09-01 ENCOUNTER — Encounter: Payer: Commercial Managed Care - PPO | Admitting: Rehabilitative and Restorative Service Providers"

## 2018-01-03 ENCOUNTER — Emergency Department
Admission: EM | Admit: 2018-01-03 | Discharge: 2018-01-03 | Disposition: A | Discharge status: Home / Self Care | Payer: Commercial Managed Care - PPO | Source: Home / Self Care | Attending: Family Medicine | Admitting: Family Medicine

## 2018-01-03 ENCOUNTER — Encounter: Payer: Self-pay | Admitting: Emergency Medicine

## 2018-01-03 DIAGNOSIS — M10062 Idiopathic gout, left knee: Secondary | ICD-10-CM | POA: Diagnosis not present

## 2018-01-03 MED ORDER — PREDNISONE 20 MG PO TABS: ORAL_TABLET | ORAL | 0 refills | Status: DC

## 2018-01-03 MED ORDER — METHYLPREDNISOLONE SODIUM SUCC 40 MG IJ SOLR
80.0000 mg | Freq: Once | INTRAMUSCULAR | Status: AC
  Administered 2018-01-03: 80 mg via INTRAMUSCULAR

## 2018-01-03 NOTE — ED Provider Notes (Signed)
[facility]    CSN: [id number] Arrival date & time: 01/03/18  0936     History   Chief Complaint Chief Complaint  Patient presents with  . Gout    HPI Michael Duran is a 52 y.o. male.   HPI Michael Duran is a 52 y.o. male presenting to UC with c/o early onset of gout flare.  He reports mild aching in his Left knee for about 1 week but states this morning his knee felt hot, aching, 2/10.  Pain is worse with ambulating but improves with ibuprofen.  He notes he typically does very well with steroid injections and would like to get the injection now before his gout pain becomes severe.  He was seen at [facility] for last gout flare on 04/17/17 and received 80mg  Solumedrol IM.  He is on daily Uloric, which has helped minimize the amount of gout flares he has.  He reports initially getting flares a few times a month but last flare was back in May 2018.  Denies recent injury to knee or knee swelling.  No other symptoms.   Past Medical History:  Diagnosis Date  . Chronic lower back pain   . Coronary artery disease   . Diabetes (HCC)    not since losing weight in 2006  . Gout   . HTN (hypertension)   . Hyperlipemia    mild  . Kidney stone    "passed it"  . Sleep apnea    not an issue with weight loss in 2006    Patient Active Problem List   Diagnosis Date Noted  . Lumbar spondylosis 07/21/2017  . CAD (coronary artery disease) 09/20/2014  . PAF (paroxysmal atrial fibrillation) (HCC) 09/06/2014  . S/P CABG x 2 09/02/2014  . DM2 (diabetes mellitus, type 2) (HCC) 08/31/2014  . HTN (hypertension) 08/31/2014  . HLD (hyperlipidemia) 08/31/2014  . Abnormal nuclear stress test 08/31/2014  . Angina effort (HCC) 08/31/2014  . Chest pain 08/18/2014  . Precordial pain 08/18/2014    Past Surgical History:  Procedure Laterality Date  . BACK SURGERY    . CARDIAC CATHETERIZATION  08/31/2014  . CORONARY ARTERY BYPASS GRAFT N/A 09/02/2014   Procedure: CORONARY ARTERY BYPASS  GRAFTING (CABG), ON PUMP, TIMES TWO, USING BILATERAL INTERNAL MAMMARY ARTERIES.;  Surgeon: Loreli Slot, MD;  Location: [facility] OR;  Service: Open Heart Surgery;  Laterality: N/A;  CABG w/ bilateral mammaries  . LEFT HEART CATHETERIZATION WITH CORONARY ANGIOGRAM N/A 08/31/2014   Procedure: LEFT HEART CATHETERIZATION WITH CORONARY ANGIOGRAM;  Surgeon: Lesleigh Noe, MD;  Location: [facility] CATH LAB;  Service: Cardiovascular;  Laterality: N/A;  . LUMBAR LAMINECTOMY  11/2003  . TEE WITHOUT CARDIOVERSION N/A 09/02/2014   Procedure: TRANSESOPHAGEAL ECHOCARDIOGRAM (TEE);  Surgeon: Loreli Slot, MD;  Location: [facility] OR;  Service: Open Heart Surgery;  Laterality: N/A;  . TONSILLECTOMY  ~ 1970       Home Medications    Prior to Admission medications   Medication Sig Start Date End Date Taking? Authorizing Provider  amLODipine (NORVASC) 10 MG tablet TAKE 1 TABLET (10 MG TOTAL) BY MOUTH DAILY. 07/22/16   Rollene Rotunda, MD  aspirin 81 MG tablet Take 81 mg by mouth daily.    [provider]  atorvastatin (LIPITOR) 80 MG tablet TAKE ONE TABLET BY MOUTH ONE TIME DAILY 08/26/17   Rollene Rotunda, MD  febuxostat (ULORIC) 40 MG tablet Take 40 mg by mouth daily.    [provider]  Fish  Oil-Cholecalciferol (FISH OIL + D3 PO) Take by mouth.    [provider]  losartan (COZAAR) 100 MG tablet TAKE 1 TABLET (100 MG TOTAL) BY MOUTH DAILY. 07/22/16   Rollene Rotunda, MD  meloxicam (MOBIC) 15 MG tablet One tab PO qAM with breakfast for 2 weeks, then daily prn pain. Patient not taking: Reported on 08/18/2017 07/21/17   Monica Becton, MD  metoprolol succinate (TOPROL-XL) 25 MG 24 hr tablet TAKE 1 TABLET BY MOUTH EVERY DAY 07/30/17   Rollene Rotunda, MD  predniSONE (DELTASONE) 20 MG tablet 3 tabs po day one, then 2 po daily x 4 days 01/03/18   Lurene Shadow, PA-C  sildenafil (REVATIO) 20 MG tablet Take 2-3 tablets (40-60 mg total) by mouth daily as needed. 05/14/17   Rollene Rotunda, MD   valACYclovir (VALTREX) 1000 MG tablet Take 1 tablet (1,000 mg total) by mouth 3 (three) times daily. Patient not taking: Reported on 08/18/2017 05/13/17   Lattie Haw, MD    Family History Family History  Problem Relation Age of Onset  . CAD Mother 30       CABG  . Heart failure Mother   . CAD Father 52       AVR  . Heart attack Father   . Diabetes Brother   . Stroke Neg Hx     Social History Social History   Tobacco Use  . Smoking status: Never Smoker  . Smokeless tobacco: Never Used  Substance Use Topics  . Alcohol use: No  . Drug use: No     Allergies   Patient has no known allergies.   Review of Systems Review of Systems  Constitutional: Negative for chills and fever.  Musculoskeletal: Positive for arthralgias and myalgias. Negative for joint swelling.  Skin: Negative for color change and wound.  Neurological: Negative for weakness and numbness.     Physical Exam Triage Vital Signs ED Triage Vitals [01/03/18 0955]  Enc Vitals Group     BP 120/77     Pulse Rate 67     Resp      Temp 97.9 F (36.6 C)     Temp Source Oral     SpO2 97 %     Weight 219 lb (99.3 kg)     Height 5\' 10"  (1.778 m)     Head Circumference      Peak Flow      Pain Score 2     Pain Loc      Pain Edu?      Excl. in GC?    No data found.  Updated Vital Signs BP 120/77 (BP Location: Right Arm)   Pulse 67   Temp 97.9 F (36.6 C) (Oral)   Ht 5\' 10"  (1.778 m)   Wt 219 lb (99.3 kg)   SpO2 97%   BMI 31.42 kg/m   Visual Acuity Right Eye Distance:   Left Eye Distance:   Bilateral Distance:    Right Eye Near:   Left Eye Near:    Bilateral Near:     Physical Exam  Constitutional: He is oriented to person, place, and time. He appears well-developed and well-nourished. No distress.  HENT:  Head: Normocephalic and atraumatic.  Eyes: EOM are normal.  Neck: Normal range of motion.  Cardiovascular: Normal rate.  Pulmonary/Chest: Effort normal.  Musculoskeletal:  Normal range of motion. He exhibits tenderness. He exhibits no edema.  Left knee: no edema. Full ROM. Mild diffuse tenderness. No crepitus.  Neurological: He is alert and oriented to person, place, and time.  Skin: Skin is warm and dry. No rash noted. He is not diaphoretic. No erythema.  Left knee: skin in tact. No ecchymosis or erythema.   Psychiatric: He has a normal mood and affect. His behavior is normal.  Nursing note and vitals reviewed.    UC Treatments / Results  Labs (all labs ordered are listed, but only abnormal results are displayed) Labs Reviewed - No data to display  EKG  EKG Interpretation None       Radiology No results found.  Procedures Procedures (including critical care time)  Medications Ordered in UC Medications  methylPREDNISolone sodium succinate (SOLU-MEDROL) 40 mg/mL injection 80 mg (80 mg Intramuscular Given 01/03/18 1016)     Initial Impression / Assessment and Plan / UC Course  I have reviewed the triage vital signs and the nursing notes.  Pertinent labs & imaging results that were available during my care of the patient were reviewed by me and considered in my medical decision making (see chart for details).     Hx and exam c/w gout flare. No evidence of septic joint.  Solumedrol 80mg  IM given as pt has done well with this treatment in the past. Encouraged f/u with PCP or Sports Medicine for ongoing care of gout flares and chronic joint pain as pt notes he "takes a lot of ibuprofen."   Home care instructions provided.   Final Clinical Impressions(s) / UC Diagnoses   Final diagnoses:  Acute idiopathic gout of left knee    ED Discharge Orders        Ordered    predniSONE (DELTASONE) 20 MG tablet     01/03/18 1018       Controlled Substance Prescriptions [place] Controlled Substance Registry consulted? Not Applicable   Rolla Platehelps, Lynnsey Barbara O, PA-C 01/03/18 1030

## 2018-01-03 NOTE — Discharge Instructions (Signed)
°  You were given a shot of solumedrol (a steroid) today to help with inflammation and pain in your Left knee.  You have been prescribed 5 days of prednisone, an oral steroid.  You may start this medication tomorrow with breakfast.

## 2018-01-03 NOTE — ED Triage Notes (Signed)
Patient is complaining of gout in left knee, pt requesting a cortisone injection, taking Ibuprofen for pain.

## 2018-03-10 ENCOUNTER — Telehealth: Payer: Self-pay | Admitting: Cardiology

## 2018-03-10 MED ORDER — AMLODIPINE BESYLATE 10 MG PO TABS: ORAL_TABLET | ORAL | 0 refills | Status: DC

## 2018-03-10 MED ORDER — ATORVASTATIN CALCIUM 80 MG PO TABS: 80.0000 mg | ORAL_TABLET | Freq: Every day | ORAL | 0 refills | Status: DC

## 2018-03-10 NOTE — Telephone Encounter (Signed)
New Message    *STAT* If patient is at the pharmacy, call can be transferred to refill team.   1. Which medications need to be refilled? (please list name of each medication and dose if known) atorvastatin (LIPITOR) 80 MG tablet  2. Which pharmacy/location (including street and city if local pharmacy) is medication to be sent to? [facility] [address]  3. Do they need a 30 day or 90 day supply? 90 day

## 2018-05-17 NOTE — Progress Notes (Signed)
HPI The patient for followup after CABG.  At the last visit he was having some vague chest pain and I had planned a POET (Plain Old Exercise Treadmill).  However, he did not want to have this done.  Since then he has done well.  The patient denies any new symptoms such as chest discomfort, neck or arm discomfort. There has been no new shortness of breath, PND or orthopnea. There have been no reported palpitations, presyncope or syncope.  His weight is up a little.  He says his lifestyle has not been as good as it could be.  He is exercising routinely.   No Known Allergies  Current Outpatient Medications  Medication Sig Dispense Refill  . aspirin 81 MG tablet Take 81 mg by mouth daily.    Marland Kitchen atorvastatin (LIPITOR) 80 MG tablet Take 1 tablet (80 mg total) by mouth daily. NEED OV. 90 tablet 0  . febuxostat (ULORIC) 40 MG tablet Take 40 mg by mouth daily.    . Fish Oil-Cholecalciferol (FISH OIL + D3 PO) Take by mouth.    . losartan (COZAAR) 100 MG tablet TAKE 1 TABLET (100 MG TOTAL) BY MOUTH DAILY. 90 tablet 2  . metoprolol succinate (TOPROL-XL) 25 MG 24 hr tablet TAKE 1 TABLET BY MOUTH EVERY DAY 90 tablet 3  . sildenafil (REVATIO) 20 MG tablet Take 2-3 tablets (40-60 mg total) by mouth daily as needed. 30 tablet 3   No current facility-administered medications for this visit.     Past Medical History:  Diagnosis Date  . Chronic lower back pain   . Coronary artery disease   . Diabetes (HCC)    not since losing weight in 2006  . Gout   . HTN (hypertension)   . Hyperlipemia    mild  . Kidney stone    "passed it"  . Sleep apnea    not an issue with weight loss in 2006    Past Surgical History:  Procedure Laterality Date  . BACK SURGERY    . CARDIAC CATHETERIZATION  08/31/2014  . CORONARY ARTERY BYPASS GRAFT N/A 09/02/2014   Procedure: CORONARY ARTERY BYPASS GRAFTING (CABG), ON PUMP, TIMES TWO, USING BILATERAL INTERNAL MAMMARY ARTERIES.;  Surgeon: Loreli Slot, MD;   Location: [facility] OR;  Service: Open Heart Surgery;  Laterality: N/A;  CABG w/ bilateral mammaries  . LEFT HEART CATHETERIZATION WITH CORONARY ANGIOGRAM N/A 08/31/2014   Procedure: LEFT HEART CATHETERIZATION WITH CORONARY ANGIOGRAM;  Surgeon: Lesleigh Noe, MD;  Location: [facility] CATH LAB;  Service: Cardiovascular;  Laterality: N/A;  . LUMBAR LAMINECTOMY  11/2003  . TEE WITHOUT CARDIOVERSION N/A 09/02/2014   Procedure: TRANSESOPHAGEAL ECHOCARDIOGRAM (TEE);  Surgeon: Loreli Slot, MD;  Location: [facility] OR;  Service: Open Heart Surgery;  Laterality: N/A;  . TONSILLECTOMY  ~ 1970    ROS: Mild back pain with exercise. . Otherwise as stated in the HPI and negative for all other systems.  PHYSICAL EXAM BP 114/68   Pulse 66   Ht 5\' 10"  (1.778 m)   Wt 220 lb 6.4 oz (100 kg)   BMI 31.62 kg/m   GENERAL:  Well appearing NECK:  No jugular venous distention, waveform within normal limits, carotid upstroke brisk and symmetric, no bruits, no thyromegaly LUNGS:  Clear to auscultation bilaterally CHEST: Well healed sternotomy scar. HEART:  PMI not displaced or sustained,S1 and S2 within normal limits, no S3, no S4, no clicks, no rubs, 2 out of 6 apical systolic murmur  heard slightly to the axilla, no diastolic murmurs ABD:  Flat, positive bowel sounds normal in frequency in pitch, no bruits, no rebound, no guarding, no midline pulsatile mass, no hepatomegaly, no splenomegaly EXT:  2 plus pulses throughout, no edema, no cyanosis no clubbing   EKG: Sinus rhythm, rate 66, axis within normal limits, intervals within normal limits, no acute ST-T wave changes.   ASSESSMENT AND PLAN   CAD:  The patient has no new sypmtoms.  No further cardiovascular testing is indicated.  We will continue with aggressive risk reduction and meds as listed.  DYSLIPIDEMIA:     LDL was 83 last week.  I would like this to be less than 70.  Triglycerides, HDL, total cholesterol were OK.  He would like to pursue diet before changing  his medications.  He will do this and we will check a lipid profile in six months.   HTN:  The blood pressure is at target.  No change in therapy.   OVERWEIGHT:    We talked again about diet and exercise.   ED:  I will renew Viagra.    MURMUR:  I will order an echo.  I suspect some MR and have not noted this before.  He does not have symptoms.

## 2018-05-18 ENCOUNTER — Encounter: Payer: Self-pay | Admitting: Cardiology

## 2018-05-18 ENCOUNTER — Ambulatory Visit: Payer: Commercial Managed Care - PPO | Admitting: Cardiology

## 2018-05-18 VITALS — BP 114/68 | HR 66 | Ht 70.0 in | Wt 220.4 lb

## 2018-05-18 DIAGNOSIS — I10 Essential (primary) hypertension: Secondary | ICD-10-CM | POA: Diagnosis not present

## 2018-05-18 DIAGNOSIS — I2510 Atherosclerotic heart disease of native coronary artery without angina pectoris: Secondary | ICD-10-CM | POA: Diagnosis not present

## 2018-05-18 DIAGNOSIS — R011 Cardiac murmur, unspecified: Secondary | ICD-10-CM

## 2018-05-18 DIAGNOSIS — E785 Hyperlipidemia, unspecified: Secondary | ICD-10-CM

## 2018-05-18 MED ORDER — SILDENAFIL CITRATE 20 MG PO TABS: 40.0000 mg | ORAL_TABLET | Freq: Every day | ORAL | 3 refills | Status: DC | PRN

## 2018-05-18 NOTE — Patient Instructions (Signed)
Medication Instructions:  Continue current medications  If you need a refill on your cardiac medications before your next appointment, please call your pharmacy.  Labwork: Fasting Lipids in 6 Months HERE IN OUR OFFICE AT [facility]  Take the provided lab slips with you to the lab for your blood draw.   You will need to fast. DO NOT EAT OR DRINK PAST MIDNIGHT.   Testing/Procedures: Your physician has requested that you have an echocardiogram. Echocardiography is a painless test that uses sound waves to create images of your heart. It provides your doctor with information about the size and shape of your heart and how well your heart's chambers and valves are working. This procedure takes approximately one hour. There are no restrictions for this procedure.  Follow-Up: Your physician wants you to follow-up in: 1 Year. You should receive a reminder letter in the mail two months in advance. If you do not receive a letter, please call our office [phone].     Thank you for choosing [facility]!!

## 2018-05-25 ENCOUNTER — Other Ambulatory Visit: Payer: Self-pay

## 2018-05-25 ENCOUNTER — Ambulatory Visit (HOSPITAL_COMMUNITY): Payer: Commercial Managed Care - PPO | Attending: Cardiology

## 2018-05-25 ENCOUNTER — Encounter: Payer: Self-pay | Admitting: Cardiology

## 2018-05-25 DIAGNOSIS — Z951 Presence of aortocoronary bypass graft: Secondary | ICD-10-CM | POA: Diagnosis not present

## 2018-05-25 DIAGNOSIS — R011 Cardiac murmur, unspecified: Secondary | ICD-10-CM | POA: Insufficient documentation

## 2018-05-25 DIAGNOSIS — I2510 Atherosclerotic heart disease of native coronary artery without angina pectoris: Secondary | ICD-10-CM | POA: Insufficient documentation

## 2018-05-25 DIAGNOSIS — E785 Hyperlipidemia, unspecified: Secondary | ICD-10-CM | POA: Insufficient documentation

## 2018-05-25 DIAGNOSIS — I088 Other rheumatic multiple valve diseases: Secondary | ICD-10-CM | POA: Insufficient documentation

## 2018-05-25 DIAGNOSIS — G4730 Sleep apnea, unspecified: Secondary | ICD-10-CM | POA: Insufficient documentation

## 2018-05-25 DIAGNOSIS — I10 Essential (primary) hypertension: Secondary | ICD-10-CM | POA: Diagnosis not present

## 2018-07-20 ENCOUNTER — Encounter: Payer: Self-pay | Admitting: Sports Medicine

## 2018-07-21 MED ORDER — DICLOFENAC SODIUM 2 % TD SOLN: 2.0000 | Freq: Two times a day (BID) | TRANSDERMAL | 11 refills | Status: DC

## 2018-07-29 ENCOUNTER — Encounter: Payer: Self-pay | Admitting: Sports Medicine

## 2018-10-06 ENCOUNTER — Other Ambulatory Visit: Payer: Self-pay

## 2018-10-06 ENCOUNTER — Emergency Department (HOSPITAL_COMMUNITY): Payer: Commercial Managed Care - PPO

## 2018-10-06 ENCOUNTER — Emergency Department (HOSPITAL_COMMUNITY)
Admission: EM | Admit: 2018-10-06 | Discharge: 2018-10-07 | Disposition: A | Discharge status: Home / Self Care | Payer: Commercial Managed Care - PPO | Attending: Emergency Medicine | Admitting: Emergency Medicine

## 2018-10-06 ENCOUNTER — Telehealth: Payer: Self-pay | Admitting: Cardiology

## 2018-10-06 ENCOUNTER — Encounter (HOSPITAL_COMMUNITY): Payer: Self-pay | Admitting: *Deleted

## 2018-10-06 DIAGNOSIS — I10 Essential (primary) hypertension: Secondary | ICD-10-CM | POA: Diagnosis not present

## 2018-10-06 DIAGNOSIS — R079 Chest pain, unspecified: Secondary | ICD-10-CM | POA: Diagnosis not present

## 2018-10-06 DIAGNOSIS — Z7982 Long term (current) use of aspirin: Secondary | ICD-10-CM | POA: Diagnosis not present

## 2018-10-06 DIAGNOSIS — Z79899 Other long term (current) drug therapy: Secondary | ICD-10-CM | POA: Diagnosis not present

## 2018-10-06 DIAGNOSIS — M79602 Pain in left arm: Secondary | ICD-10-CM | POA: Diagnosis not present

## 2018-10-06 DIAGNOSIS — E119 Type 2 diabetes mellitus without complications: Secondary | ICD-10-CM | POA: Diagnosis not present

## 2018-10-06 LAB — CBC
HCT: 44.8 % (ref 39.0–52.0)
Hemoglobin: 15.1 g/dL (ref 13.0–17.0)
MCH: 28.4 pg (ref 26.0–34.0)
MCHC: 33.7 g/dL (ref 30.0–36.0)
MCV: 84.4 fL (ref 80.0–100.0)
Platelets: 278 10*3/uL (ref 150–400)
RBC: 5.31 MIL/uL (ref 4.22–5.81)
RDW: 12.6 % (ref 11.5–15.5)
WBC: 8.3 10*3/uL (ref 4.0–10.5)
nRBC: 0 % (ref 0.0–0.2)

## 2018-10-06 LAB — I-STAT TROPONIN, ED: Troponin i, poc: 0 ng/mL (ref 0.00–0.08)

## 2018-10-06 LAB — BASIC METABOLIC PANEL
Anion gap: 10 (ref 5–15)
BUN: 15 mg/dL (ref 6–20)
CO2: 24 mmol/L (ref 22–32)
Calcium: 10.2 mg/dL (ref 8.9–10.3)
Chloride: 102 mmol/L (ref 98–111)
Creatinine, Ser: 0.95 mg/dL (ref 0.61–1.24)
GFR calc Af Amer: >60 mL/min (ref >60)
GLUCOSE: 126 mg/dL — AB (ref 70–99)
Potassium: 3.9 mmol/L (ref 3.5–5.1)
SODIUM: 136 mmol/L (ref 135–145)

## 2018-10-06 MED ORDER — ASPIRIN 81 MG PO CHEW
324.0000 mg | CHEWABLE_TABLET | Freq: Once | ORAL | Status: DC
  Filled 2018-10-06: qty 4

## 2018-10-06 NOTE — ED Notes (Signed)
Family at bedside. 

## 2018-10-06 NOTE — Telephone Encounter (Signed)
Patient wanted to inform Dr. Antoine PocheHochrein that he is planning to present to the Emergency Department this evening to be evaluated for "musculoskeletal pain" that is localized to a rib under his left breast and radiates to his back.   I reassured him that appropriate evaluation could be performed in the Emergency Department.   Lauren K. Charna Busmanruby, MD

## 2018-10-06 NOTE — ED Notes (Signed)
Patient transported to X-ray 

## 2018-10-06 NOTE — ED Triage Notes (Signed)
Pt reports Lt sided CP with Lt arm pain for * days. Pt had a CABG [facility].

## 2018-10-06 NOTE — ED Provider Notes (Signed)
[facility] EMERGENCY DEPARTMENT Provider Note   CSN: [id number] Arrival date & time: 10/06/18  2116     History   Chief Complaint Chief Complaint  Patient presents with  . Chest Pain    HPI Michael Duran is a 52 y.o. male.  52 year old male with a history of coronary artery disease who presents the emergency department today with nonspecific chest pain.  Patient states he has had some episodes of epigastric chest discomfort also with left lateral chest pain that is worse with palpation but not worse with breathing.  Also has had some pain above his left clavicle.  This is been going on for the last couple weeks but then he wanted to test himself and intentionally walked a long ways and up the steps in the cold to the point he was short of breath.  Shortly after he started having some left arm heaviness which resolved after about 30 minutes.  Has had some intermittent episodes of lightheadedness but nothing related to exertion.   Chest Pain   This is a new problem. The current episode started more than 1 week ago. The problem occurs constantly. The problem has not changed since onset.   Past Medical History:  Diagnosis Date  . Chronic lower back pain   . Coronary artery disease   . Diabetes (HCC)    not since losing weight in 2006  . Gout   . HTN (hypertension)   . Hyperlipemia    mild  . Kidney stone    "passed it"  . Sleep apnea    not an issue with weight loss in 2006    Patient Active Problem List   Diagnosis Date Noted  . Lumbar spondylosis 07/21/2017  . CAD (coronary artery disease) 09/20/2014  . PAF (paroxysmal atrial fibrillation) (HCC) 09/06/2014  . S/P CABG x 2 09/02/2014  . DM2 (diabetes mellitus, type 2) (HCC) 08/31/2014  . HTN (hypertension) 08/31/2014  . HLD (hyperlipidemia) 08/31/2014  . Abnormal nuclear stress test 08/31/2014  . Angina effort 08/31/2014  . Chest pain 08/18/2014  . Precordial pain 08/18/2014    Past Surgical  History:  Procedure Laterality Date  . BACK SURGERY    . CARDIAC CATHETERIZATION  08/31/2014  . CORONARY ARTERY BYPASS GRAFT N/A 09/02/2014   Procedure: CORONARY ARTERY BYPASS GRAFTING (CABG), ON PUMP, TIMES TWO, USING BILATERAL INTERNAL MAMMARY ARTERIES.;  Surgeon: Loreli Slot, MD;  Location: [facility] OR;  Service: Open Heart Surgery;  Laterality: N/A;  CABG w/ bilateral mammaries  . LEFT HEART CATHETERIZATION WITH CORONARY ANGIOGRAM N/A 08/31/2014   Procedure: LEFT HEART CATHETERIZATION WITH CORONARY ANGIOGRAM;  Surgeon: Lesleigh Noe, MD;  Location: [facility] CATH LAB;  Service: Cardiovascular;  Laterality: N/A;  . LUMBAR LAMINECTOMY  11/2003  . TEE WITHOUT CARDIOVERSION N/A 09/02/2014   Procedure: TRANSESOPHAGEAL ECHOCARDIOGRAM (TEE);  Surgeon: Loreli Slot, MD;  Location: [facility] OR;  Service: Open Heart Surgery;  Laterality: N/A;  . TONSILLECTOMY  ~ 1970        Home Medications    Prior to Admission medications   Medication Sig Start Date End Date Taking? Authorizing Provider  acetaminophen (TYLENOL) 500 MG tablet Take 1,000 mg by mouth every 6 (six) hours as needed.   Yes [provider]  amLODipine (NORVASC) 10 MG tablet Take 10 mg by mouth daily.   Yes [provider]  aspirin 81 MG tablet Take 81 mg by mouth daily.   Yes [provider]  atorvastatin (LIPITOR)  80 MG tablet Take 1 tablet (80 mg total) by mouth daily. NEED OV. 03/10/18  Yes Rollene Rotunda, MD  Diclofenac Sodium 2 % SOLN Place 2 sprays onto the skin 2 (two) times daily. Patient taking differently: Place 2 sprays onto the skin 2 (two) times daily as needed (pain).  07/21/18  Yes Monica Becton, MD  febuxostat (ULORIC) 40 MG tablet Take 40 mg by mouth daily.   Yes [provider]  Fish Oil-Cholecalciferol (FISH OIL + D3 PO) Take 1 capsule by mouth 2 (two) times daily.    Yes [provider]  ibuprofen (ADVIL,MOTRIN) 200 MG tablet Take 200-800 mg by mouth every 6  (six) hours as needed for moderate pain.   Yes [provider]  losartan (COZAAR) 100 MG tablet TAKE 1 TABLET (100 MG TOTAL) BY MOUTH DAILY. 07/22/16  Yes Rollene Rotunda, MD  metoprolol succinate (TOPROL-XL) 25 MG 24 hr tablet TAKE 1 TABLET BY MOUTH EVERY DAY Patient taking differently: Take 25 mg by mouth daily.  07/30/17  Yes Rollene Rotunda, MD  sildenafil (REVATIO) 20 MG tablet Take 2-3 tablets (40-60 mg total) by mouth daily as needed. Patient taking differently: Take 40-60 mg by mouth daily as needed (ED).  05/18/18  Yes Rollene Rotunda, MD    Family History Family History  Problem Relation Age of Onset  . CAD Mother 32       CABG  . Heart failure Mother   . CAD Father 36       AVR  . Heart attack Father   . Diabetes Brother   . Stroke Neg Hx     Social History Social History   Tobacco Use  . Smoking status: Never Smoker  . Smokeless tobacco: Never Used  Substance Use Topics  . Alcohol use: No  . Drug use: No     Allergies   Patient has no known allergies.   Review of Systems Review of Systems  Cardiovascular: Positive for chest pain.  All other systems reviewed and are negative.    Physical Exam Updated Vital Signs BP (!) 152/99   Pulse 81   Temp 98.7 F (37.1 C) (Oral)   Resp (!) 28   Ht 5\' 10"  (1.778 m)   Wt 102.1 kg   SpO2 100%   BMI 32.28 kg/m   Physical Exam  Constitutional: He is oriented to person, place, and time. He appears well-developed and well-nourished.  HENT:  Head: Normocephalic and atraumatic.  Neck: Normal range of motion.  Cardiovascular: Normal rate and normal pulses.  Pulmonary/Chest: Effort normal. No respiratory distress.  Abdominal: He exhibits no distension.  Musculoskeletal: Normal range of motion.  Neurological: He is alert and oriented to person, place, and time.  Skin: Skin is warm and dry.  Nursing note and vitals reviewed.    ED Treatments / Results  Labs (all labs ordered are listed, but only  abnormal results are displayed) Labs Reviewed  BASIC METABOLIC PANEL - Abnormal; Notable for the following components:      Result Value   Glucose, Bld 126 (*)    All other components within normal limits  CBC  I-STAT TROPONIN, ED    EKG EKG Interpretation  Date/Time:  Tuesday October 06 2018 21:25:04 EST Ventricular Rate:  80 PR Interval:    QRS Duration: 104 QT Interval:  386 QTC Calculation: 446 R Axis:   -6 Text Interpretation:  Sinus rhythm Consider left atrial enlargement No significant change since last tracing Confirmed by Zyliah Schier,  Barbara Cower [phone]) on 10/06/2018 9:38:08 PM   Radiology Dg Chest 2 View  Result Date: 10/06/2018 CLINICAL DATA:  Intermittent chest pain. EXAM: CHEST - 2 VIEW COMPARISON:  Apr 17, 2017 FINDINGS: The heart size and mediastinal contours are within normal limits. Both lungs are clear. The visualized skeletal structures are unremarkable. IMPRESSION: No active cardiopulmonary disease. Electronically Signed   By: Gerome Sam III M.D   On: 10/06/2018 22:52    Procedures Procedures (including critical care time)  Medications Ordered in ED Medications  aspirin chewable tablet 324 mg (has no administration in time range)     Initial Impression / Assessment and Plan / ED Course  I have reviewed the triage vital signs and the nursing notes.  Pertinent labs & imaging results that were available during my care of the patient were reviewed by me and considered in my medical decision making (see chart for details).  Patient with a very atypical story does not really sound like coronary artery disease or ACS however there is an exertional component to the episode today so will discuss with cardiology.  Cardiology will come to see and decide on disposition.  Patient still chest pain-free.  Care transferred pending cardiology recommendations.   Final Clinical Impressions(s) / ED Diagnoses   Final diagnoses:  Chest pain, unspecified type    ED  Discharge Orders    None       Asianae Minkler, Barbara Cower, MD 10/08/18 [phone]

## 2018-10-07 ENCOUNTER — Encounter: Payer: Self-pay | Admitting: Cardiology

## 2018-10-07 DIAGNOSIS — M79602 Pain in left arm: Secondary | ICD-10-CM

## 2018-10-07 LAB — I-STAT TROPONIN, ED: TROPONIN I, POC: 0 ng/mL (ref 0.00–0.08)

## 2018-10-07 NOTE — Telephone Encounter (Signed)
error 

## 2018-10-07 NOTE — Discharge Instructions (Addendum)
Your seen today for chest pain.  Your work-up is reassuring.  Follow-up with Dr. Antoine PocheHochrein later this week.

## 2018-10-07 NOTE — Progress Notes (Signed)
   As part of 6am call, fellow left message patient needs close ER follow-up. Sent message to schedulers to arrange.  Corrin ParkerCallie E Goodrich, PA-C 10/07/2018 6:21 AM

## 2018-10-07 NOTE — ED Provider Notes (Signed)
Patient signed out pending cardiology evaluation.  In brief, patient presents with atypical chest pain.  History of CABG several years ago.  EKG was nonischemic.  Initial troponin was negative.  Patient was evaluated by cardiology fellow.  Recommends repeat troponin.  If negative, follow-up with cardiologist later this week.  Repeat troponin negative.  Patient discharged with close cardiology follow-up.     Shon BatonHorton, Kyler Lerette F, MD 10/07/18 [phone]

## 2018-10-07 NOTE — Consult Note (Signed)
Cardiology Consultation:   Patient ID: Michael Duran MRN: [id number]; DOB: [birth year]  Admit date: 10/06/2018 Date of Consult: 10/07/2018  Primary Care Provider: Tally Joe, MD Primary Cardiologist: No primary care provider on file.  Primary Electrophysiologist:  None    Patient Profile:   Michael Duran is a 52 y.o. male with a hx of CAD s/p CABG 2015, HTN, HLD, obesity who presents with L arm pain.  History of Present Illness:   Michael Duran arrived to the ER today with a 1-2 week history of intermittent thoracic discomfort. He states that he has noted intermittent pain under his L breast radiating to his back, occasionally RUQ pain that feels musculoskeletal, and overall has felt more acid reflux and gas like symptoms than before. These symptoms are not reliably exertional and are in no way related to the symptoms he had prior to his CABG (which was profound dyspnea). Today, he climbed multiple flights of stairs and walked over half a mile in the cold without provocation of any symptoms. On a car ride home he developed some L arm pain, which concerned him. In the context of his non-specific symptoms (and his wife preparing to leave town on a trip) he decided to present to the ED for further evaluation.   Upon arrival he was normotensive, asymptomatic. EKG was SR without concerning ST changes and troponins were negative x 2. Cardiology was consulted for disposition.   Of note, he last saw Dr. Antoine Poche in 04/2018. At that time, Dr. Antoine Poche had recommended a treadmill stress test to evaluate some non-specific chest pain, but the patient had not been amenable at that time. Since his last visit, the patient has gained weight (on account of not exercising) and noted his BP to be a bit higher than previously. TTE done on 7/1 demonstrated a normal EF with some mild aortic sclerosis.   Past Medical History:  Diagnosis Date  . Chronic lower back pain   . Coronary artery disease   . Diabetes  (HCC)    not since losing weight in 2006  . Gout   . HTN (hypertension)   . Hyperlipemia    mild  . Kidney stone    "passed it"  . Sleep apnea    not an issue with weight loss in 2006    Past Surgical History:  Procedure Laterality Date  . BACK SURGERY    . CARDIAC CATHETERIZATION  08/31/2014  . CORONARY ARTERY BYPASS GRAFT N/A 09/02/2014   Procedure: CORONARY ARTERY BYPASS GRAFTING (CABG), ON PUMP, TIMES TWO, USING BILATERAL INTERNAL MAMMARY ARTERIES.;  Surgeon: Loreli Slot, MD;  Location: [facility] OR;  Service: Open Heart Surgery;  Laterality: N/A;  CABG w/ bilateral mammaries  . LEFT HEART CATHETERIZATION WITH CORONARY ANGIOGRAM N/A 08/31/2014   Procedure: LEFT HEART CATHETERIZATION WITH CORONARY ANGIOGRAM;  Surgeon: Lesleigh Noe, MD;  Location: [facility] CATH LAB;  Service: Cardiovascular;  Laterality: N/A;  . LUMBAR LAMINECTOMY  11/2003  . TEE WITHOUT CARDIOVERSION N/A 09/02/2014   Procedure: TRANSESOPHAGEAL ECHOCARDIOGRAM (TEE);  Surgeon: Loreli Slot, MD;  Location: [facility] OR;  Service: Open Heart Surgery;  Laterality: N/A;  . TONSILLECTOMY  ~ 1970     Home Medications:  Prior to Admission medications   Medication Sig Start Date End Date Taking? Authorizing Provider  acetaminophen (TYLENOL) 500 MG tablet Take 1,000 mg by mouth every 6 (six) hours as needed.   Yes [provider]  amLODipine (NORVASC) 10 MG tablet Take 10 mg by  mouth daily.   Yes [provider]  aspirin 81 MG tablet Take 81 mg by mouth daily.   Yes [provider]  atorvastatin (LIPITOR) 80 MG tablet Take 1 tablet (80 mg total) by mouth daily. NEED OV. 03/10/18  Yes Rollene RotundaHochrein, James, MD  Diclofenac Sodium 2 % SOLN Place 2 sprays onto the skin 2 (two) times daily. Patient taking differently: Place 2 sprays onto the skin 2 (two) times daily as needed (pain).  07/21/18  Yes Monica Bectonhekkekandam, Thomas J, MD  febuxostat (ULORIC) 40 MG tablet Take 40 mg by mouth daily.   Yes [provider]  Fish Oil-Cholecalciferol (FISH OIL + D3 PO) Take 1 capsule by mouth 2 (two) times daily.    Yes [provider]  ibuprofen (ADVIL,MOTRIN) 200 MG tablet Take 200-800 mg by mouth every 6 (six) hours as needed for moderate pain.   Yes [provider]  losartan (COZAAR) 100 MG tablet TAKE 1 TABLET (100 MG TOTAL) BY MOUTH DAILY. 07/22/16  Yes Rollene RotundaHochrein, James, MD  metoprolol succinate (TOPROL-XL) 25 MG 24 hr tablet TAKE 1 TABLET BY MOUTH EVERY DAY Patient taking differently: Take 25 mg by mouth daily.  07/30/17  Yes Rollene RotundaHochrein, James, MD  sildenafil (REVATIO) 20 MG tablet Take 2-3 tablets (40-60 mg total) by mouth daily as needed. Patient taking differently: Take 40-60 mg by mouth daily as needed (ED).  05/18/18  Yes Rollene RotundaHochrein, James, MD    Inpatient Medications: Scheduled Meds: . aspirin  324 mg Oral Once   Continuous Infusions:  PRN Meds:   Allergies:   No Known Allergies  Social History:   Social History   Socioeconomic History  . Marital status: Married    Spouse name: Not on file  . Number of children: 3  . Years of education: Not on file  . Highest education level: Not on file  Occupational History  . Occupation: Airline pilotales  Social Needs  . Financial resource strain: Not on file  . Food insecurity:    Worry: Not on file    Inability: Not on file  . Transportation needs:    Medical: Not on file    Non-medical: Not on file  Tobacco Use  . Smoking status: Never Smoker  . Smokeless tobacco: Never Used  Substance and Sexual Activity  . Alcohol use: No  . Drug use: No  . Sexual activity: Yes  Lifestyle  . Physical activity:    Days per week: Not on file    Minutes per session: Not on file  . Stress: Not on file  Relationships  . Social connections:    Talks on phone: Not on file    Gets together: Not on file    Attends religious service: Not on file    Active member of club or organization: Not on file    Attends meetings of clubs or organizations: Not on  file    Relationship status: Not on file  . Intimate partner violence:    Fear of current or ex partner: Not on file    Emotionally abused: Not on file    Physically abused: Not on file    Forced sexual activity: Not on file  Other Topics Concern  . Not on file  Social History Narrative   Lives at home with 4 children.      Family History:    Family History  Problem Relation Age of Onset  . CAD Mother 1569       CABG  .  Heart failure Mother   . CAD Father 2       AVR  . Heart attack Father   . Diabetes Brother   . Stroke Neg Hx     Review of Systems: [y] = yes, [ ]  = no     General: Weight gain [ y]; Weight loss [ ] ; Anorexia [ ] ; Fatigue [ ] ; Fever [ ] ; Chills [ ] ; Weakness [ ]    Cardiac: Chest pain/pressure [ ] ; Resting SOB [ ] ; Exertional SOB [ ] ; Orthopnea [ ] ; Pedal Edema [ ] ; Palpitations [ ] ; Syncope [ ] ; Presyncope [ ] ; Paroxysmal nocturnal dyspnea[ ]    Pulmonary: Cough [ ] ; Wheezing[ ] ; Hemoptysis[ ] ; Sputum [ ] ; Snoring [ ]    GI: Vomiting[ ] ; Dysphagia[ ] ; Melena[ ] ; Hematochezia [ ] ; Heartburn[ ] ; Abdominal pain [place] ]; Constipation [ ] ; Diarrhea [ ] ; BRBPR [ ]    GU: Hematuria[ ] ; Dysuria [ ] ; Nocturia[ ]    Vascular: Pain in legs with walking [ ] ; Pain in feet with lying flat [ ] ; Non-healing sores [ ] ; Stroke [ ] ; TIA [ ] ; Slurred speech [ ] ;   Neuro: Headaches[ ] ; Vertigo[ ] ; Seizures[ ] ; Paresthesias[ ] ;Blurred vision [ ] ; Diplopia [ ] ; Vision changes [ ]    Ortho/Skin: Arthritis [ ] ; Joint pain [ ] ; Muscle pain [ ] ; Joint swelling [ ] ; Back Pain [ ] ; Rash [ ]    Psych: Depression[ ] ; Anxiety[ ]    Heme: Bleeding problems [ ] ; Clotting disorders [ ] ; Anemia [ ]    Endocrine: Diabetes [ ] ; Thyroid dysfunction[ ]   Physical Exam/Data:   Vitals:   10/06/18 2345 10/07/18 0015 10/07/18 0045 10/07/18 0115  BP: 116/77 117/80 127/89 123/84  Pulse: 64 (!) 59 (!) 57 66  Resp: 19 17 18 15   Temp:      TempSrc:      SpO2: 95% 95% 98% 99%  Weight:      Height:        No intake or output data in the 24 hours ending 10/07/18 0156 Filed Weights   10/06/18 2124  Weight: 102.1 kg   Body mass index is 32.28 kg/m.  General:  Well nourished, well developed, in no acute distress HEENT: normal Lymph: no adenopathy Neck: no JVD Endocrine:  No thryomegaly Vascular: No carotid bruits; FA pulses 2+ bilaterally without bruits  Cardiac:  normal S1, S2; RRR; II/IV systolic murmur at lower sternal border.  Lungs:  clear to auscultation bilaterally, no wheezing, rhonchi or rales  Abd: soft, nontender, no hepatomegaly  Ext: no edema Musculoskeletal:  No deformities, BUE and BLE strength normal and equal Skin: warm and dry  Neuro:  CNs 2-12 intact, no focal abnormalities noted Psych:  Normal affect   EKG:  The EKG was personally reviewed and demonstrates:  SR with LAE and Q in III, AVF.   Relevant CV Studies: Study Conclusions  - Left ventricle: The cavity size was normal. Wall thickness was   normal. Systolic function was normal. The estimated ejection   fraction was in the range of 55% to 60%. Left ventricular   diastolic function parameters were normal. - Aortic valve: Sclerosis without stenosis. - Mitral valve: Calcified annulus. Mildly thickened leaflets . - Left atrium: The atrium was mildly dilated. - Atrial septum: No defect or patent foramen ovale was identified.  Laboratory Data:  Chemistry Recent Labs  Lab 10/06/18 2130  NA 136  K 3.9  CL 102  CO2 24  GLUCOSE 126*  BUN 15  CREATININE 0.95  CALCIUM 10.2  GFRNONAA >60  GFRAA >60  ANIONGAP 10    No results for input(s): PROT, ALBUMIN, AST, ALT, ALKPHOS, BILITOT in the last 168 hours. Hematology Recent Labs  Lab 10/06/18 2130  WBC 8.3  RBC 5.31  HGB 15.1  HCT 44.8  MCV 84.4  MCH 28.4  MCHC 33.7  RDW 12.6  PLT 278   Cardiac EnzymesNo results for input(s): TROPONINI in the last 168 hours.  Recent Labs  Lab 10/06/18 2134 10/07/18 0123  TROPIPOC 0.00 0.00    BNPNo  results for input(s): BNP, PROBNP in the last 168 hours.  DDimer No results for input(s): DDIMER in the last 168 hours.  Radiology/Studies:  Dg Chest 2 View  Result Date: 10/06/2018 CLINICAL DATA:  Intermittent chest pain. EXAM: CHEST - 2 VIEW COMPARISON:  Apr 17, 2017 FINDINGS: The heart size and mediastinal contours are within normal limits. Both lungs are clear. The visualized skeletal structures are unremarkable. IMPRESSION: No active cardiopulmonary disease. Electronically Signed   By: Gerome Sam III M.D   On: 10/06/2018 22:52    Assessment and Plan:   Michael Duran is a 52 year old man with CAD s/p CABG, HTN, obesity who presents with a constellation of atypical symptoms including worsening GERD, increased belching, bilateral rib pain, and L arm pain. He presents for evaluation out of concern that these symptoms relate to his heart disease. His presentation is clearly atypical, and I am reassured by how difference these symptoms are than what he had prior to his CABG. His EKG and troponins are equally reassuring. Given that Dr. Antoine Poche had previously considered an outpatient treadmill exercise test, I recommended to the patient that we try and schedule a stress test in the outpatient setting to better evaluate his symptoms. He preferred to be discharged home tonight and follow up in the AM with his primary cardiologists rather than stay for the tests.   [facility] will sign off.    Follow up as an outpatient:  Dr. Antoine Poche for visit v. Treadmill stress test.   For questions or updates, please contact [facility] Please consult [email or web address] for contact info under    Signed, Laurell Josephs, MD  10/07/2018 1:56 AM

## 2018-10-07 NOTE — ED Notes (Signed)
Patient verbalizes understanding of medications and discharge instructions. No further questions at this time. VSS and patient ambulatory at discharge.   

## 2018-10-14 NOTE — Progress Notes (Signed)
HPI The patient for followup after CABG.  Since I last saw him he was in the ED last week with chest pain.  I reviewed these records for this visit.  EKG was non acute and enzymes negative.  This was thought to be atypical chest pain.  He describes various aches and pains in his chest.  This is not like previous angina.  He has had pain in the both breasts in the axilla.  Some of its been radiating.  Is been dull.  Some scar around of his back.  He has had aches and pains in his arms.  He is able to do walking and is done this recently without any symptoms.  Again it is not like previous angina.  He has not had associated nausea vomiting or diaphoresis.  He has not had any shortness of breath, PND or orthopnea.  He had described some discomfort in the past and I had tried to do a POET (Plain Old Exercise Treadmill) on him but he subsequently declined.  He is unfortunately gained about 20 pounds because he is not eating right.  He has had some symptoms that have been improved since he started taking Prilosec.  He has not had any new swelling cough fevers or chills.  No Known Allergies  Current Outpatient Medications  Medication Sig Dispense Refill  . acetaminophen (TYLENOL) 500 MG tablet Take 1,000 mg by mouth every 6 (six) hours as needed.    Marland Kitchen amLODipine (NORVASC) 10 MG tablet Take 10 mg by mouth daily.    Marland Kitchen aspirin 81 MG tablet Take 81 mg by mouth daily.    Marland Kitchen atorvastatin (LIPITOR) 80 MG tablet Take 1 tablet (80 mg total) by mouth daily. NEED OV. 90 tablet 0  . Diclofenac Sodium 2 % SOLN Place 2 sprays onto the skin 2 (two) times daily. (Patient taking differently: Place 2 sprays onto the skin 2 (two) times daily as needed (pain). ) 1 Bottle 11  . febuxostat (ULORIC) 40 MG tablet Take 40 mg by mouth daily.    . Fish Oil-Cholecalciferol (FISH OIL + D3 PO) Take 1 capsule by mouth 2 (two) times daily.     Marland Kitchen ibuprofen (ADVIL,MOTRIN) 200 MG tablet Take 200-800 mg by mouth every 6 (six) hours as  needed for moderate pain.    Marland Kitchen losartan (COZAAR) 100 MG tablet TAKE 1 TABLET (100 MG TOTAL) BY MOUTH DAILY. 90 tablet 2  . metoprolol succinate (TOPROL-XL) 25 MG 24 hr tablet TAKE 1 TABLET BY MOUTH EVERY DAY (Patient taking differently: Take 25 mg by mouth daily. ) 90 tablet 3  . sildenafil (REVATIO) 20 MG tablet Take 2-3 tablets (40-60 mg total) by mouth daily as needed. (Patient taking differently: Take 40-60 mg by mouth daily as needed (ED). ) 30 tablet 3   No current facility-administered medications for this visit.     Past Medical History:  Diagnosis Date  . Chronic lower back pain   . Coronary artery disease   . Diabetes (HCC)    not since losing weight in 2006  . Gout   . HTN (hypertension)   . Hyperlipemia    mild  . Kidney stone    "passed it"  . Sleep apnea    not an issue with weight loss in 2006    Past Surgical History:  Procedure Laterality Date  . BACK SURGERY    . CARDIAC CATHETERIZATION  08/31/2014  . CORONARY ARTERY BYPASS GRAFT N/A 09/02/2014  Procedure: CORONARY ARTERY BYPASS GRAFTING (CABG), ON PUMP, TIMES TWO, USING BILATERAL INTERNAL MAMMARY ARTERIES.;  Surgeon: Loreli SlotSteven C Hendrickson, MD;  Location: [facility] OR;  Service: Open Heart Surgery;  Laterality: N/A;  CABG w/ bilateral mammaries  . LEFT HEART CATHETERIZATION WITH CORONARY ANGIOGRAM N/A 08/31/2014   Procedure: LEFT HEART CATHETERIZATION WITH CORONARY ANGIOGRAM;  Surgeon: Lesleigh NoeHenry W Smith III, MD;  [facility];  Service: Cardiovascular;  Laterality: N/A;  . LUMBAR LAMINECTOMY  11/2003  . TEE WITHOUT CARDIOVERSION N/A 09/02/2014   Procedure: TRANSESOPHAGEAL ECHOCARDIOGRAM (TEE);  Surgeon: Loreli SlotSteven C Hendrickson, MD;  [facility];  Service: Open Heart Surgery;  Laterality: N/A;  . TONSILLECTOMY  ~ 1970    ROS:   Positive for erectile dysfunction.  Otherwise as stated in the HPI and negative for all other systems.  PHYSICAL EXAM BP 140/72   Pulse 71   Ht 5\' 10"  (1.778 m)   Wt 225 lb 3.2 oz (102.2  kg)   BMI 32.31 kg/m   GENERAL:  Well appearing NECK:  No jugular venous distention, waveform within normal limits, carotid upstroke brisk and symmetric, no bruits, no thyromegaly LUNGS:  Clear to auscultation bilaterally CHEST:  Well healed sternotomy scar. HEART:  PMI not displaced or sustained,S1 and S2 within normal limits, no S3, no S4, no clicks, no rubs, brief apical nonradiating systolic murmur, no diastolic murmurs ABD:  Flat, positive bowel sounds normal in frequency in pitch, no bruits, no rebound, no guarding, no midline pulsatile mass, no hepatomegaly, no splenomegaly EXT:  2 plus pulses throughout, no edema, no cyanosis no clubbing   EKG: Sinus rhythm, rate 71, axis within normal limits, intervals within normal limits, no acute ST-T wave changes.   ASSESSMENT AND PLAN   CHEST PAIN: His chest pain is atypical.  However, given his past history he does need to be screened.  I think the pretest probability of obstructive coronary disease is low I will bring the patient back for a POET (Plain Old Exercise Test). This will allow me to screen for obstructive coronary disease, risk stratify and very importantly provide a prescription for exercise.  He understands the most important therapy in his situation is secondary risk reduction.  CAD: This will be evaluated as above.  DYSLIPIDEMIA:     LDL was not quite at target.  He understands the need for diet and exercise and I would repeat this after he is improved his diet and functional level.   HTN:    The blood pressure is at target. No change in medications is indicated. We will continue with therapeutic lifestyle changes (TLC).  OVERWEIGHT:    We talked about specifics of diet and exercise and  MURMUR:  On echo recently he had mild aortic valve calcification.  I will follow this clinically.

## 2018-10-15 ENCOUNTER — Ambulatory Visit: Payer: Commercial Managed Care - PPO | Admitting: Cardiology

## 2018-10-15 ENCOUNTER — Encounter: Payer: Self-pay | Admitting: Cardiology

## 2018-10-15 VITALS — BP 140/72 | HR 71 | Ht 70.0 in | Wt 225.2 lb

## 2018-10-15 DIAGNOSIS — I2510 Atherosclerotic heart disease of native coronary artery without angina pectoris: Secondary | ICD-10-CM | POA: Diagnosis not present

## 2018-10-15 DIAGNOSIS — E785 Hyperlipidemia, unspecified: Secondary | ICD-10-CM

## 2018-10-15 DIAGNOSIS — I10 Essential (primary) hypertension: Secondary | ICD-10-CM | POA: Diagnosis not present

## 2018-10-15 DIAGNOSIS — I350 Nonrheumatic aortic (valve) stenosis: Secondary | ICD-10-CM

## 2018-10-15 DIAGNOSIS — E669 Obesity, unspecified: Secondary | ICD-10-CM

## 2018-10-15 DIAGNOSIS — R079 Chest pain, unspecified: Secondary | ICD-10-CM | POA: Diagnosis not present

## 2018-10-15 NOTE — Patient Instructions (Signed)
Medication Instructions:  Continue current medications  If you need a refill on your cardiac medications before your next appointment, please call your pharmacy.  Labwork: None Ordered   If you have labs (blood work) drawn today and your tests are completely normal, you will receive your results only by: Marland Kitchen. MyChart Message (if you have MyChart) OR . A paper copy in the mail If you have any lab test that is abnormal or we need to change your treatment, we will call you to review the results.  Testing/Procedures: Your physician has requested that you have an exercise tolerance test. For further information please visit [email or web address] Please also follow instruction sheet, as given.   Follow-Up: You will need a follow up appointment in 6 Months.  Please call our office 2 months in [phone]) to schedule the (6 Months) appointment.  You may see  DR Antoine PocheHochrein, or one of the following Advanced Practice Providers on your designated Care Team:    . Joni ReiningKathryn Lawrence, DNP, ANP . Rhonda Barrett, PA-C  . Corine ShelterLuke Kilroy, [place] JerseyPA-C  . Azalee CourseHao Meng, PA-C . Micah FlesherAngela Duke, PA-C  At [facility], you and your health needs are our priority.  As part of our continuing mission to provide you with exceptional heart care, we have created designated Provider Care Teams.  These Care Teams include your primary Cardiologist (physician) and Advanced Practice Providers (APPs -  Physician Assistants and Nurse Practitioners) who all work together to provide you with the care you need, when you need it.   Thank you for choosing [facility]!!

## 2018-10-16 IMAGING — DX DG CHEST 2V
2 series · 2 of 2 positions shown · non-contrast
Comparison: 12/29/2015.

CLINICAL DATA: BILATERAL upper back pain.  Congestion.

EXAM:
CHEST  2 VIEW

[chest pa]
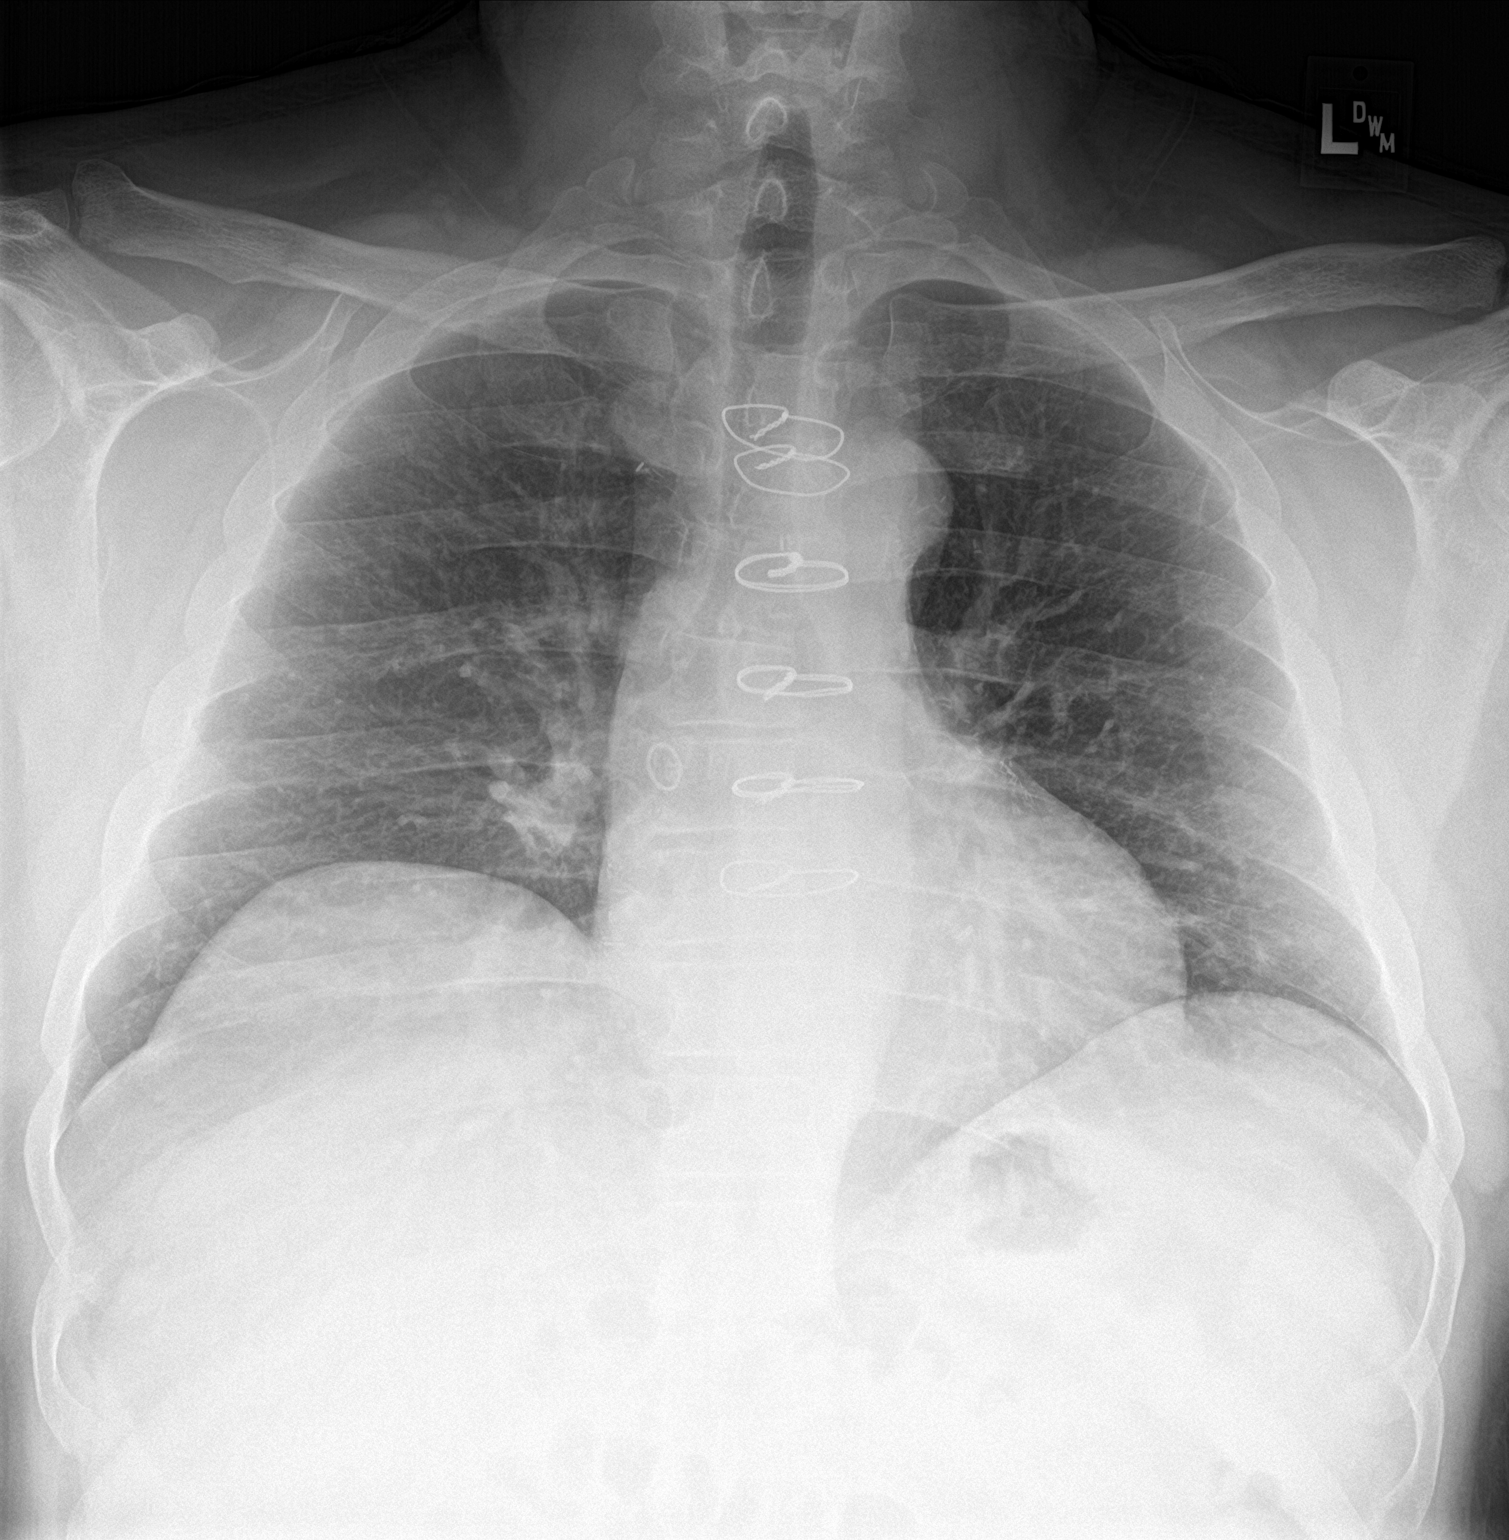

[chest lat]
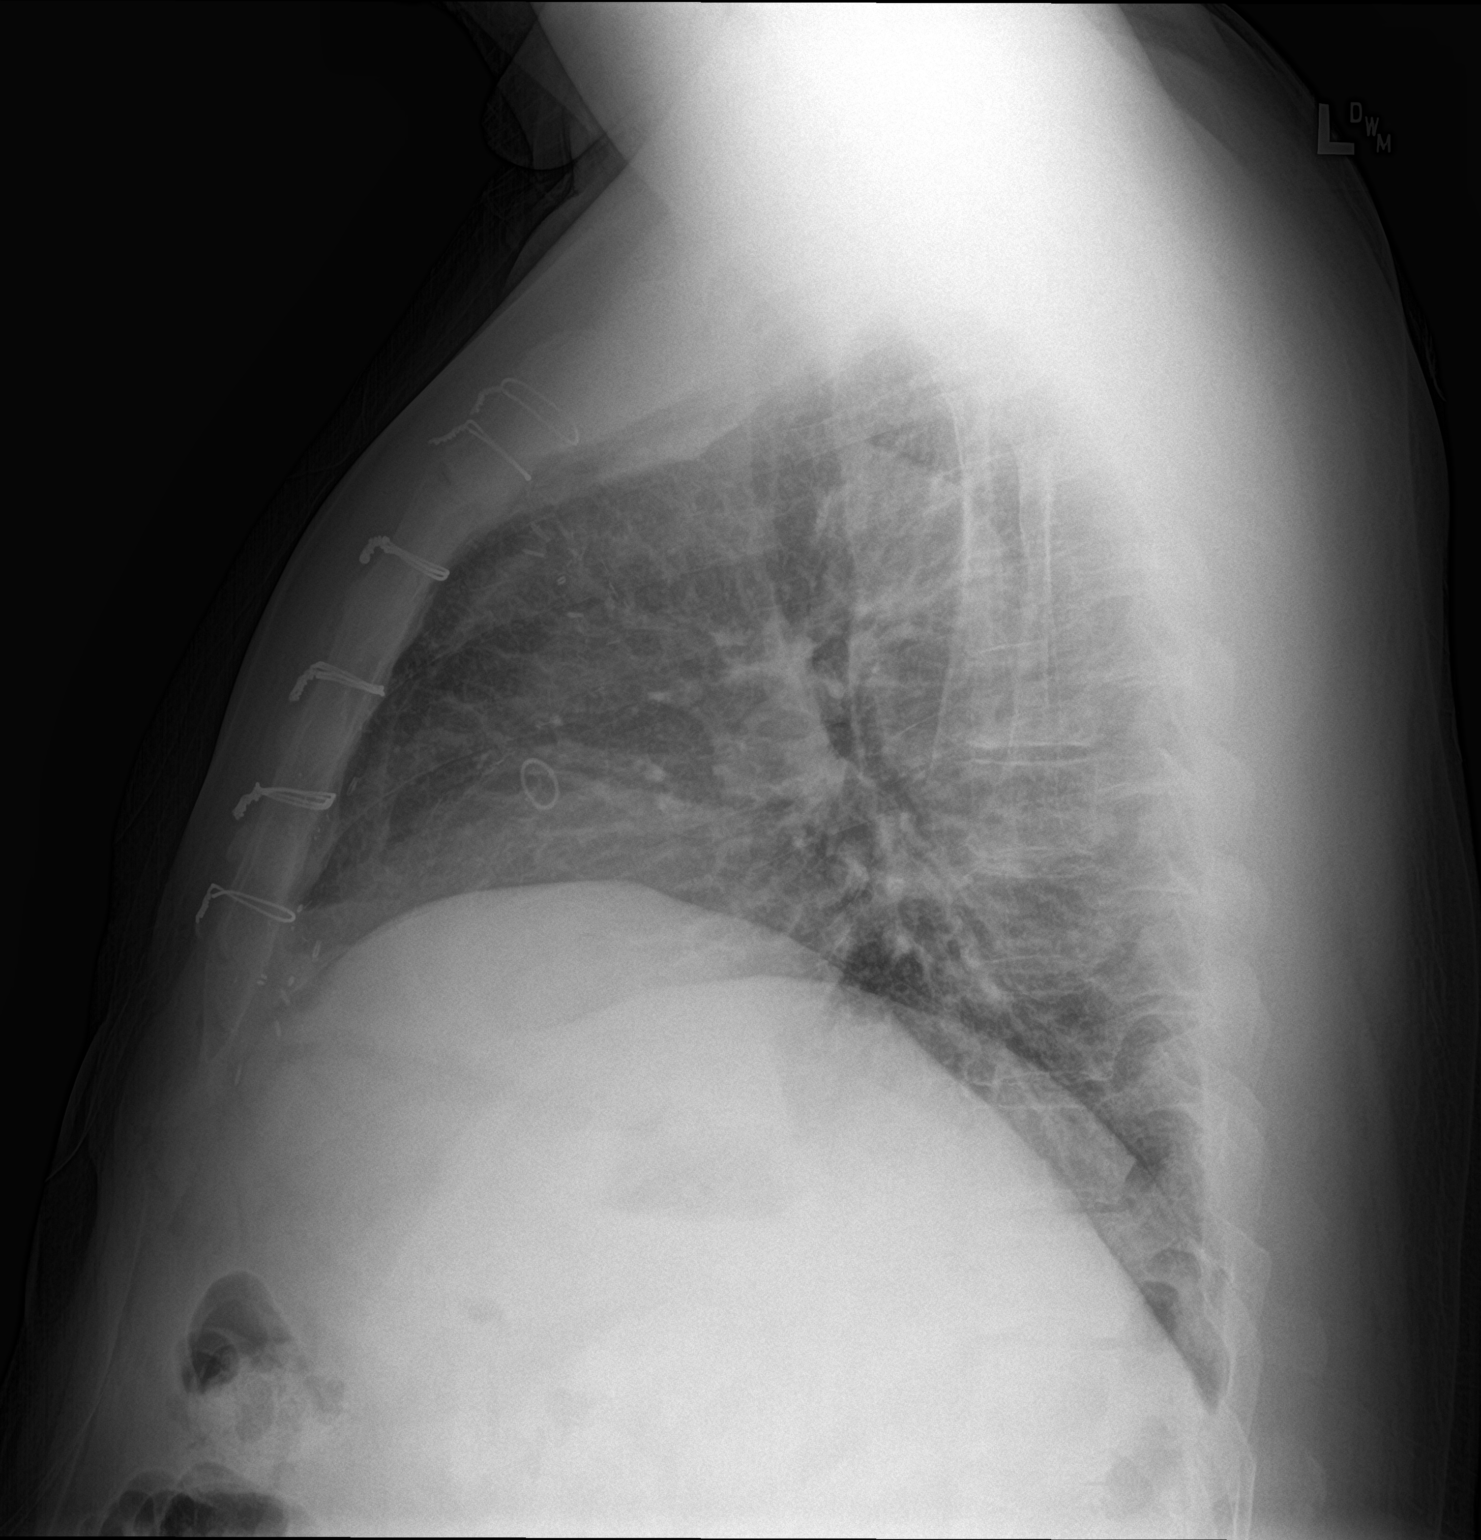

[2 of 2 positions shown; findings below may reference images not displayed]

FINDINGS: Cardiac silhouette within normal limits. Prior CABG. Mild vascular
congestion. No active infiltrates or failure. No effusion or
pneumothorax. Bones unremarkable. Similar appearance to priors.
IMPRESSION: Normal heart size. Mild vascular congestion. No active infiltrates
or failure.

## 2018-10-20 ENCOUNTER — Telehealth (HOSPITAL_COMMUNITY): Payer: Self-pay

## 2018-10-20 NOTE — Telephone Encounter (Signed)
Encounter complete. 

## 2018-10-26 ENCOUNTER — Ambulatory Visit: Payer: Commercial Managed Care - PPO | Admitting: Cardiology

## 2018-10-27 ENCOUNTER — Inpatient Hospital Stay (HOSPITAL_COMMUNITY): Admission: RE | Admit: 2018-10-27 | Payer: Commercial Managed Care - PPO | Source: Ambulatory Visit

## 2018-11-02 ENCOUNTER — Ambulatory Visit: Payer: Commercial Managed Care - PPO | Admitting: Cardiology

## 2019-03-04 ENCOUNTER — Other Ambulatory Visit: Payer: Self-pay | Admitting: *Deleted

## 2019-03-04 MED ORDER — ATORVASTATIN CALCIUM 80 MG PO TABS: 80.0000 mg | ORAL_TABLET | Freq: Every day | ORAL | 1 refills | Status: DC

## 2019-03-04 MED ORDER — LOSARTAN POTASSIUM 100 MG PO TABS: 100.0000 mg | ORAL_TABLET | Freq: Every day | ORAL | 1 refills | Status: DC

## 2019-03-04 MED ORDER — AMLODIPINE BESYLATE 10 MG PO TABS: 10.0000 mg | ORAL_TABLET | Freq: Every day | ORAL | 1 refills | Status: DC

## 2019-03-04 MED ORDER — METOPROLOL SUCCINATE ER 25 MG PO TB24: 25.0000 mg | ORAL_TABLET | Freq: Every day | ORAL | 1 refills | Status: DC

## 2019-04-20 ENCOUNTER — Telehealth: Payer: Self-pay | Admitting: Cardiology

## 2019-04-20 NOTE — Telephone Encounter (Signed)
LVM for pt to call and schedule 1 yr followup with Dr. Hochrein. °

## 2019-07-19 NOTE — Progress Notes (Signed)
HPI The patient for followup after CABG.  He was having chest pain when I saw him late last year.  He was to have a POET (Plain [employer] Exercise Treadmill).  However, he cancelled this.    He said that shortly after that appointment his pain stopped.  He has not had any more symptoms.  In fact he moved to a different house in July.  He said he had to carry boxes and was working in extreme heat and he had absolutely no symptoms. The patient denies any new symptoms such as chest discomfort, neck or arm discomfort. There has been no new shortness of breath, PND or orthopnea. There have been no reported palpitations, presyncope or syncope.    No Known Allergies  Current Outpatient Medications  Medication Sig Dispense Refill  . acetaminophen (TYLENOL) 500 MG tablet Take 1,000 mg by mouth every 6 (six) hours as needed.    Marland Kitchen allopurinol (ZYLOPRIM) 100 MG tablet Take 1 tablet by mouth daily.    Marland Kitchen amLODipine (NORVASC) 10 MG tablet Take 1 tablet (10 mg total) by mouth daily. 90 tablet 1  . aspirin 81 MG tablet Take 81 mg by mouth daily.    . Diclofenac Sodium 2 % SOLN Place 2 sprays onto the skin 2 (two) times daily. (Patient taking differently: Place 2 sprays onto the skin 2 (two) times daily as needed (pain). ) 1 Bottle 11  . Fish Oil-Cholecalciferol (FISH OIL + D3 PO) Take 1 capsule by mouth 2 (two) times daily.     Marland Kitchen ibuprofen (ADVIL,MOTRIN) 200 MG tablet Take 200-800 mg by mouth every 6 (six) hours as needed for moderate pain.    Marland Kitchen losartan (COZAAR) 100 MG tablet Take 1 tablet (100 mg total) by mouth daily. 90 tablet 1  . metoprolol succinate (TOPROL-XL) 25 MG 24 hr tablet Take 1 tablet (25 mg total) by mouth daily. 90 tablet 1  . sildenafil (REVATIO) 20 MG tablet Take 2-3 tablets (40-60 mg total) by mouth daily as needed. (Patient taking differently: Take 40-60 mg by mouth daily as needed (ED). ) 30 tablet 3  . rosuvastatin (CRESTOR) 40 MG tablet Take 1 tablet (40 mg total) by mouth daily. 90  tablet 3   No current facility-administered medications for this visit.     Past Medical History:  Diagnosis Date  . Chronic lower back pain   . Coronary artery disease   . Diabetes ([place])    not since losing weight in 2006  . Gout   . HTN (hypertension)   . Hyperlipemia    mild  . Kidney stone    "passed it"  . Sleep apnea    not an issue with weight loss in 2006    Past Surgical History:  Procedure Laterality Date  . BACK SURGERY    . CARDIAC CATHETERIZATION  08/31/2014  . CORONARY ARTERY BYPASS GRAFT N/A 09/02/2014   Procedure: CORONARY ARTERY BYPASS GRAFTING (CABG), ON PUMP, TIMES TWO, USING BILATERAL INTERNAL MAMMARY ARTERIES.;  Surgeon: Melrose Nakayama, MD;  Location: [place];  Service: Open Heart Surgery;  Laterality: N/A;  CABG w/ bilateral mammaries  . LEFT HEART CATHETERIZATION WITH CORONARY ANGIOGRAM N/A 08/31/2014   Procedure: LEFT HEART CATHETERIZATION WITH CORONARY ANGIOGRAM;  Surgeon: Sinclair Grooms, MD;  Location: [facility] CATH LAB;  Service: Cardiovascular;  Laterality: N/A;  . LUMBAR LAMINECTOMY  11/2003  . TEE WITHOUT CARDIOVERSION N/A 09/02/2014   Procedure: TRANSESOPHAGEAL ECHOCARDIOGRAM (TEE);  Surgeon: Remo Lipps  Lars Pinks Hendrickson, MD;  Location: [facility] OR;  Service: Open Heart Surgery;  Laterality: N/A;  . TONSILLECTOMY  ~ 1970    ROS:   Positive for  erectile dysfunction.  Otherwise as stated in the HPI and negative for all other systems.  PHYSICAL EXAM BP 118/80   Pulse 65   Ht 5\' 10"  (1.778 m)   Wt 227 lb (103 kg)   SpO2 99%   BMI 32.57 kg/m   GENERAL:  Well appearing NECK:  No jugular venous distention, waveform within normal limits, carotid upstroke brisk and symmetric, no bruits, no thyromegaly LUNGS:  Clear to auscultation bilaterally CHEST:  Unremarkable HEART:  PMI not displaced or sustained,S1 and S2 within normal limits, no S3, no S4, no clicks, no rubs, 2 out of 6 apical systolic murmur nonradiating, no diastolic murmurs ABD:  Flat, positive bowel  sounds normal in frequency in pitch, no bruits, no rebound, no guarding, no midline pulsatile mass, no hepatomegaly, no splenomegaly EXT:  2 plus pulses throughout, no edema, no cyanosis no clubbing  EKG: Sinus rhythm, rate 65, axis within normal limits, intervals within normal limits, no acute ST-T wave changes.   ASSESSMENT AND PLAN   CAD:   The patient has no new sypmtoms.  No further cardiovascular testing is indicated.  We will continue with aggressive risk reduction and meds as listed.  DYSLIPIDEMIA:     LDL was not at target.  I will change to Crestor 40 mg daily and he will get a lipid profile liver enzymes in 10 weeks.   HTN:    The blood pressure is at target.  No change in therapy.   OVERWEIGHT:     We talked about diet and exercise and had specific conversations about this.   MURMUR:  On echo recently he had mild aortic valve calcification.  This is not changed clinically.  No further testing is indicated.

## 2019-07-20 ENCOUNTER — Ambulatory Visit: Payer: Commercial Managed Care - PPO | Admitting: Cardiology

## 2019-07-20 ENCOUNTER — Other Ambulatory Visit: Payer: Self-pay

## 2019-07-20 ENCOUNTER — Encounter: Payer: Self-pay | Admitting: Cardiology

## 2019-07-20 VITALS — BP 118/80 | HR 65 | Ht 70.0 in | Wt 227.0 lb

## 2019-07-20 DIAGNOSIS — Z5181 Encounter for therapeutic drug level monitoring: Secondary | ICD-10-CM | POA: Diagnosis not present

## 2019-07-20 DIAGNOSIS — I2510 Atherosclerotic heart disease of native coronary artery without angina pectoris: Secondary | ICD-10-CM | POA: Insufficient documentation

## 2019-07-20 DIAGNOSIS — E785 Hyperlipidemia, unspecified: Secondary | ICD-10-CM | POA: Diagnosis not present

## 2019-07-20 DIAGNOSIS — I10 Essential (primary) hypertension: Secondary | ICD-10-CM

## 2019-07-20 MED ORDER — ROSUVASTATIN CALCIUM 40 MG PO TABS: 40.0000 mg | ORAL_TABLET | Freq: Every day | ORAL | 3 refills | Status: DC

## 2019-07-20 NOTE — Patient Instructions (Signed)
Medication Instructions:  STOP ATORVASTATIN  START ROSUVASTATIN 40 MG DAILY   If you need a refill on your cardiac medications before your next appointment, please call your pharmacy.   Lab work: FASTING LP/HFP IN 10 WEEKS  If you have labs (blood work) drawn today and your tests are completely normal, you will receive your results only by: Marland Kitchen MyChart Message (if you have MyChart) OR . A paper copy in the mail If you have any lab test that is abnormal or we need to change your treatment, we will call you to review the results.  Testing/Procedures: NONE  Follow-Up: At [facility], you and your health needs are our priority.  As part of our continuing mission to provide you with exceptional heart care, we have created designated Provider Care Teams.  These Care Teams include your primary Cardiologist (physician) and Advanced Practice Providers (APPs -  Physician Assistants and Nurse Practitioners) who all work together to provide you with the care you need, when you need it. You will need a follow up appointment in 12 months.  Please call our office 2 months in advance to schedule this appointment.  You may see DR [facility]  or one of the following Advanced Practice Providers on your designated Care Team:   Rosaria Ferries, PA-C . Jory Sims, DNP, ANP

## 2019-07-29 ENCOUNTER — Ambulatory Visit (INDEPENDENT_AMBULATORY_CARE_PROVIDER_SITE_OTHER): Payer: Commercial Managed Care - PPO | Admitting: Sports Medicine

## 2019-07-29 ENCOUNTER — Encounter: Payer: Self-pay | Admitting: Sports Medicine

## 2019-07-29 ENCOUNTER — Other Ambulatory Visit: Payer: Self-pay

## 2019-07-29 DIAGNOSIS — M7061 Trochanteric bursitis, right hip: Secondary | ICD-10-CM | POA: Diagnosis not present

## 2019-07-29 NOTE — Progress Notes (Signed)
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  I reviewed the past medical history, family history, social history, surgical history, and allergies today and no changes were needed.  Please see the problem list section below in epic for further details.  Past Medical History: Past Medical History:  Diagnosis Date  . Chronic lower back pain   . Coronary artery disease   . Diabetes ([place])    not since losing weight in 2006  . Gout   . HTN (hypertension)   . Hyperlipemia    mild  . Kidney stone    "passed it"  . Sleep apnea    not an issue with weight loss in 2006   Past Surgical History: Past Surgical History:  Procedure Laterality Date  . BACK SURGERY    . CARDIAC CATHETERIZATION  08/31/2014  . CORONARY ARTERY BYPASS GRAFT N/A 09/02/2014   Procedure: CORONARY ARTERY BYPASS GRAFTING (CABG), ON PUMP, TIMES TWO, USING BILATERAL INTERNAL MAMMARY ARTERIES.;  Surgeon: Melrose Nakayama, MD;  Location: [place];  Service: Open Heart Surgery;  Laterality: N/A;  CABG w/ bilateral mammaries  . LEFT HEART CATHETERIZATION WITH CORONARY ANGIOGRAM N/A 08/31/2014   Procedure: LEFT HEART CATHETERIZATION WITH CORONARY ANGIOGRAM;  Surgeon: Sinclair Grooms, MD;  Location[facility] CATH LAB;  Service: Cardiovascular;  Laterality: N/A;  . LUMBAR LAMINECTOMY  11/2003  . TEE WITHOUT CARDIOVERSION N/A 09/02/2014   Procedure: TRANSESOPHAGEAL ECHOCARDIOGRAM (TEE);  Surgeon: Melrose Nakayama, MD;  Location: [place];  Service: Open Heart Surgery;  Laterality: N/A;  . TONSILLECTOMY  ~ 1970   Social History: Social History   Socioeconomic History  . Marital status: Married    Spouse name: Not on file  . Number of children: 3  . Years of education: Not on file  . Highest education level: Not on file  Occupational History  . Occupation: Press photographer  Social  Needs  . Financial resource strain: Not on file  . Food insecurity    Worry: Not on file    Inability: Not on file  . Transportation needs    Medical: Not on file    Non-medical: Not on file  Tobacco Use  . Smoking status: Never Smoker  . Smokeless tobacco: Never Used  Substance and Sexual Activity  . Alcohol use: No  . Drug use: No  . Sexual activity: Yes  Lifestyle  . Physical activity    Days per week: Not on file    Minutes per session: Not on file  . Stress: Not on file  Relationships  . Social Herbalist on phone: Not on file    Gets together: Not on file    Attends religious service: Not on file    Active member of club or organization: Not on file    Attends meetings of clubs or organizations: Not on file    Relationship status: Not on file  Other Topics Concern  . Not on file  Social History Narrative   Lives at home with 4 children.     Family History: Family History  Problem Relation Age of Onset  . CAD Mother 38       CABG  . Heart failure Mother   . CAD Father 6       AVR  . Heart attack Father   .  Diabetes Brother   . Stroke Neg Hx    Allergies: No Known Allergies Medications: See med rec.  Review of Systems: No fevers, chills, night sweats, weight loss, chest pain, or shortness of breath.   Objective:    General: Well Developed, well nourished, and in no acute distress.  Neuro: Alert and oriented x3, extra-ocular muscles intact, sensation grossly intact.  HEENT: Normocephalic, atraumatic, pupils equal round reactive to light, neck supple, no masses, no lymphadenopathy, thyroid nonpalpable.  Skin: Warm and dry, no rashes. Cardiac: Regular rate and rhythm, no murmurs rubs or gallops, no lower extremity edema.  Respiratory: Clear to auscultation bilaterally. Not using accessory muscles, speaking in full sentences. Right hip: ROM IR: 60 Deg, ER: 60 Deg, Flexion: 120 Deg, Extension: 100 Deg, Abduction: 45 Deg, Adduction: 45 Deg  Strength IR: 5/5, ER: 5/5, Flexion: 5/5, Extension: 5/5, Abduction: 5/5, Adduction: 5/5 Pelvic alignment unremarkable to inspection and palpation. Standing hip rotation and gait without trendelenburg / unsteadiness. Greater trochanter with tenderness to palpation. No tenderness over piriformis. No SI joint tenderness and normal minimal SI movement.  Procedure: Real-time Ultrasound Guided injection of the right greater trochanteric bursa Device: GE Logiq E  Verbal informed consent obtained.  Time-out conducted.  Noted no overlying erythema, induration, or other signs of local infection.  Skin prepped in a sterile fashion.  Local anesthesia: Topical Ethyl chloride.  With sterile technique and under real time ultrasound guidance:  1 cc Kenalog 40, 2 cc lidocaine, 2 cc bupivacaine injected easily Completed without difficulty  Pain immediately resolved suggesting accurate placement of the medication.  Advised to call if fevers/chills, erythema, induration, drainage, or persistent bleeding.  Images permanently stored and available for review in the ultrasound unit.  Impression: Technically successful ultrasound guided injection.  Impression and Recommendations:    Trochanteric bursitis, right hip Unable to sleep, greater trochanteric bursa injection today, x-rays, rehab exercises given, return in a month for this.   ___________________________________________ Ihor Austinhomas J. Benjamin Stainhekkekandam, M.D., ABFM., CAQSM. Primary Care and Sports Medicine [facility]  Adjunct Professor of Family Medicine  [employer]

## 2019-07-29 NOTE — Assessment & Plan Note (Signed)
Unable to sleep, greater trochanteric bursa injection today, x-rays, rehab exercises given, return in a month for this.

## 2019-09-02 ENCOUNTER — Ambulatory Visit: Payer: Commercial Managed Care - PPO | Admitting: Sports Medicine

## 2019-11-09 ENCOUNTER — Other Ambulatory Visit: Payer: Self-pay

## 2019-11-09 LAB — LIPID PANEL
Chol/HDL Ratio: 4.6 ratio (ref 0.0–5.0)
Cholesterol, Total: 171 mg/dL (ref 100–199)
HDL: 37 mg/dL — ABNORMAL LOW (ref >39)
LDL Chol Calc (NIH): 76 mg/dL (ref 0–99)
Triglycerides: 363 mg/dL — ABNORMAL HIGH (ref 0–149)
VLDL Cholesterol Cal: 58 mg/dL — ABNORMAL HIGH (ref 5–40)

## 2019-11-09 LAB — HEPATIC FUNCTION PANEL
ALT: 53 IU/L — ABNORMAL HIGH (ref 0–44)
AST: 34 IU/L (ref 0–40)
Albumin: 4.4 g/dL (ref 3.8–4.9)
Alkaline Phosphatase: 81 IU/L (ref 39–117)
Bilirubin Total: 0.6 mg/dL (ref 0.0–1.2)
Bilirubin, Direct: 0.15 mg/dL (ref 0.00–0.40)
Total Protein: 6.8 g/dL (ref 6.0–8.5)

## 2019-11-09 MED ORDER — SILDENAFIL CITRATE 20 MG PO TABS: 40.0000 mg | ORAL_TABLET | Freq: Every day | ORAL | 3 refills | Status: DC | PRN

## 2019-11-09 MED ORDER — LOSARTAN POTASSIUM 100 MG PO TABS: 100.0000 mg | ORAL_TABLET | Freq: Every day | ORAL | 3 refills | Status: DC

## 2019-11-09 MED ORDER — ROSUVASTATIN CALCIUM 40 MG PO TABS: 40.0000 mg | ORAL_TABLET | Freq: Every day | ORAL | 3 refills | Status: DC

## 2019-11-09 NOTE — Telephone Encounter (Signed)
Received the following secure chat message from Philbert Riser on 12/15/2020[name], ([phone]) Michael Duran needs a 90 days refill on all his medication. Cozzar, Crestor, Revatio. He came in the office today. Thanks   Refills sent to pt pharmacy on file

## 2019-11-10 ENCOUNTER — Other Ambulatory Visit: Payer: Self-pay | Admitting: *Deleted

## 2019-11-10 DIAGNOSIS — E785 Hyperlipidemia, unspecified: Secondary | ICD-10-CM

## 2019-11-10 NOTE — Telephone Encounter (Signed)
Pt aware of lab results and recommendations ./cy 

## 2020-04-10 ENCOUNTER — Encounter: Payer: Self-pay | Admitting: Sports Medicine

## 2020-04-10 ENCOUNTER — Ambulatory Visit (INDEPENDENT_AMBULATORY_CARE_PROVIDER_SITE_OTHER): Payer: Commercial Managed Care - PPO | Admitting: Sports Medicine

## 2020-04-10 DIAGNOSIS — M7061 Trochanteric bursitis, right hip: Secondary | ICD-10-CM

## 2020-04-10 DIAGNOSIS — M7541 Impingement syndrome of right shoulder: Secondary | ICD-10-CM | POA: Diagnosis not present

## 2020-04-10 DIAGNOSIS — M47816 Spondylosis without myelopathy or radiculopathy, lumbar region: Secondary | ICD-10-CM | POA: Diagnosis not present

## 2020-04-10 NOTE — Progress Notes (Signed)
    Procedures performed today:    Procedure: Real-time Ultrasound Guided injection of the right greater trochanteric bursa Device: Samsung HS60  Verbal informed consent obtained.  Time-out conducted.  Noted no overlying erythema, induration, or other signs of local infection.  Skin prepped in a sterile fashion.  Local anesthesia: Topical Ethyl chloride.  With sterile technique and under real time ultrasound guidance: 1 cc Kenalog 40, 2 cc lidocaine, 2 cc bupivacaine injected easily Completed without difficulty  Pain immediately resolved suggesting accurate placement of the medication.  Advised to call if fevers/chills, erythema, induration, drainage, or persistent bleeding.  Images permanently stored and available for review in the ultrasound unit.  Impression: Technically successful ultrasound guided injection.  Independent interpretation of notes and tests performed by another provider:   None.  Brief History, Exam, Impression, and Recommendations:    Trochanteric bursitis, right hip Michael Duran returns, we last injected his greater trochanteric bursa back in September 2020, he is having recurrence of pain on the lateral hip. He does endorse some pain radiating across the anterior thigh to the medial knee. We are going to start with a trochanteric bursa injection, if this does not work then we will proceed with intervention into his lumbar spine.  Lumbar spondylosis [name] does have a history of a microdiscectomy at what he tells me was L5-S1, he is having pain in his back radiating around the anterior thigh, medial knee and an L3 versus an L4 distribution. If he does not respond to the trochanteric bursa injection as below we will proceed with further evaluation of his lumbar spine. Of note his last MRI was from [facility] in 2016.  Impingement syndrome, shoulder, right [name] has impingement symptoms over the deltoid, with a positive Neer sign.  Positive empty can  sign. Starting conservatively, x-rays, home rehab exercises. Return to see me in 6 weeks, injection if no better.    ___________________________________________ [name]. [name], M.D., ABFM., CAQSM. Primary Care and Sports Medicine [facility]  Adjunct Instructor of Family Medicine  [employer]

## 2020-04-10 NOTE — Assessment & Plan Note (Signed)
Michael Duran has impingement symptoms over the deltoid, with a positive Neer sign.  Positive empty can sign. Starting conservatively, x-rays, home rehab exercises. Return to see me in 6 weeks, injection if no better.

## 2020-04-10 NOTE — Assessment & Plan Note (Signed)
Michael Duran returns, we last injected his greater trochanteric bursa back in September 2020, he is having recurrence of pain on the lateral hip. He does endorse some pain radiating across the anterior thigh to the medial knee. We are going to start with a trochanteric bursa injection, if this does not work then we will proceed with intervention into his lumbar spine.

## 2020-04-10 NOTE — Assessment & Plan Note (Signed)
Trebor does have a history of a microdiscectomy at what he tells me was L5-S1, he is having pain in his back radiating around the anterior thigh, medial knee and an L3 versus an L4 distribution. If he does not respond to the trochanteric bursa injection as below we will proceed with further evaluation of his lumbar spine. Of note his last MRI was from [facility] in 2016.

## 2020-05-22 ENCOUNTER — Ambulatory Visit: Payer: Commercial Managed Care - PPO | Admitting: Sports Medicine

## 2020-06-07 ENCOUNTER — Telehealth: Payer: Self-pay | Admitting: Cardiology

## 2020-06-07 NOTE — Telephone Encounter (Signed)
LVM for patient to return call to get scheduled with Hochrein from recall list 

## 2020-07-17 DIAGNOSIS — R011 Cardiac murmur, unspecified: Secondary | ICD-10-CM | POA: Insufficient documentation

## 2020-07-17 DIAGNOSIS — Z7189 Other specified counseling: Secondary | ICD-10-CM | POA: Insufficient documentation

## 2020-07-17 NOTE — Progress Notes (Signed)
Cardiology Office Note   Date:  07/18/2020   ID:  Michael Duran, DOB 1966-02-21, MRN [id number]  PCP:  Tally Joe, MD  Cardiologist:   No primary care provider on file.   Chief Complaint  Patient presents with  . Coronary Artery Disease      History of Present Illness: FRANCISCA HARBUCK is a 54 y.o. male who presents for follow up of CAD after CABG.  Since I last saw him he has done well.  The patient denies any new symptoms such as chest discomfort, neck or arm discomfort. There has been no new shortness of breath, PND or orthopnea. There have been no reported palpitations, presyncope or syncope.  He is active in his yard but not exercising as much as I would like mostly because of the pandemic.  Past Medical History:  Diagnosis Date  . Chronic lower back pain   . Coronary artery disease   . Diabetes ([facility])    not since losing weight in 2006  . Gout   . HTN (hypertension)   . Hyperlipemia    mild  . Kidney stone    "passed it"  . Sleep apnea    not an issue with weight loss in 2006    Past Surgical History:  Procedure Laterality Date  . BACK SURGERY    . CARDIAC CATHETERIZATION  08/31/2014  . CORONARY ARTERY BYPASS GRAFT N/A 09/02/2014   Procedure: CORONARY ARTERY BYPASS GRAFTING (CABG), ON PUMP, TIMES TWO, USING BILATERAL INTERNAL MAMMARY ARTERIES.;  Surgeon: Loreli Slot, MD;  Location: [facility] OR;  Service: Open Heart Surgery;  Laterality: N/A;  CABG w/ bilateral mammaries  . LEFT HEART CATHETERIZATION WITH CORONARY ANGIOGRAM N/A 08/31/2014   Procedure: LEFT HEART CATHETERIZATION WITH CORONARY ANGIOGRAM;  Surgeon: Lesleigh Noe, MD;  Location: [facility] CATH LAB;  Service: Cardiovascular;  Laterality: N/A;  . LUMBAR LAMINECTOMY  11/2003  . TEE WITHOUT CARDIOVERSION N/A 09/02/2014   Procedure: TRANSESOPHAGEAL ECHOCARDIOGRAM (TEE);  Surgeon: Loreli Slot, MD;  Location: [facility] OR;  Service: Open Heart Surgery;  Laterality: N/A;  . TONSILLECTOMY  ~ 1970      Current Outpatient Medications  Medication Sig Dispense Refill  . acetaminophen (TYLENOL) 500 MG tablet Take 1,000 mg by mouth every 6 (six) hours as needed.    Marland Kitchen allopurinol (ZYLOPRIM) 100 MG tablet Take 1 tablet by mouth daily.    Marland Kitchen amLODipine (NORVASC) 10 MG tablet Take 1 tablet (10 mg total) by mouth daily. 90 tablet 1  . aspirin 81 MG tablet Take 81 mg by mouth daily.    . famotidine (PEPCID) 20 MG tablet Take 20 mg by mouth 2 (two) times daily.    . Fish Oil-Cholecalciferol (FISH OIL + D3 PO) Take 1 capsule by mouth 2 (two) times daily.     . hydrOXYzine (ATARAX/VISTARIL) 25 MG tablet Take 50 mg by mouth at bedtime.    Marland Kitchen ibuprofen (ADVIL,MOTRIN) 200 MG tablet Take 200-800 mg by mouth every 6 (six) hours as needed for moderate pain.    Marland Kitchen losartan (COZAAR) 100 MG tablet Take 1 tablet (100 mg total) by mouth daily. 90 tablet 3  . metoprolol succinate (TOPROL-XL) 25 MG 24 hr tablet Take 1 tablet (25 mg total) by mouth daily. 90 tablet 1  . rosuvastatin (CRESTOR) 40 MG tablet Take 1 tablet (40 mg total) by mouth daily. 90 tablet 3  . sildenafil (REVATIO) 20 MG tablet Take 2-3 tablets (40-60 mg total) by mouth daily  as needed. 30 tablet 3   No current facility-administered medications for this visit.    Allergies:   Patient has no known allergies.   ROS:  Please see the history of present illness.   Otherwise, review of systems are positive for none.   All other systems are reviewed and negative.    PHYSICAL EXAM: VS:  BP 116/82   Pulse 64   Ht 5\' 10"  (1.778 m)   Wt 220 lb 3.2 oz (99.9 kg)   SpO2 97%   BMI 31.60 kg/m  , BMI Body mass index is 31.6 kg/m. GENERAL:  Well appearing NECK:  No jugular venous distention, waveform within normal limits, carotid upstroke brisk and symmetric, no bruits, no thyromegaly LUNGS:  Clear to auscultation bilaterally CHEST:  Well healed sternotomy scar. HEART:  PMI not displaced or sustained,S1 and S2 within normal limits, no S3, no S4, no  clicks, no rubs, 2 out of 6 apical brief systolic murmur heard at the apex and radiating slightly at the aortic outflow tract, no diastolic murmurs ABD:  Flat, positive bowel sounds normal in frequency in pitch, no bruits, no rebound, no guarding, no midline pulsatile mass, no hepatomegaly, no splenomegaly EXT:  2 plus pulses throughout, no edema, no cyanosis no clubbing   EKG:  EKG is ordered today. The ekg ordered today demonstrates sinus rhythm, rate 64, axis within normal limits, intervals within normal limits, no acute ST-T wave changes.   Recent Labs: 11/09/2019: ALT 53    Lipid Panel    Component Value Date/Time   CHOL 171 11/09/2019 0858   TRIG 363 (H) 11/09/2019 0858   HDL 37 (L) 11/09/2019 0858   CHOLHDL 4.6 11/09/2019 0858   LDLCALC 76 11/09/2019 0858      Wt Readings from Last 3 Encounters:  07/18/20 220 lb 3.2 oz (99.9 kg)  04/10/20 233 lb (105.7 kg)  07/29/19 228 lb (103.4 kg)      Other studies Reviewed: Additional studies/ records that were reviewed today include: Labs. Review of the above records demonstrates:  Please see elsewhere in the note.     ASSESSMENT AND PLAN:   CAD:   The patient has no new sypmtoms.  No further cardiovascular testing is indicated.  We will continue with aggressive risk reduction and meds as listed.  DYSLIPIDEMIA:     LDL was slightly elevated last year and is about to get this checked again.  The goal should be an LDL less than 70.  I did increase his Crestor previously.  If he is not at target he might need Zetia.   HTN:    The blood pressure is at target.  No change in therapy.   MURMUR:   He had mild aortic sclerosis on him echo last year.  No change in therapy.   COVID EDUCATION: He has been vaccinated.  Current medicines are reviewed at length with the patient today.  The patient does not have concerns regarding medicines.  The following changes have been made:  no change  Labs/ tests ordered today include:  None  Orders Placed This Encounter  Procedures  . EKG 12-Lead     Disposition:   FU with me in one year.     Signed, 09/28/19, MD  07/18/2020 10:32 AM    [facility]

## 2020-07-18 ENCOUNTER — Other Ambulatory Visit: Payer: Self-pay

## 2020-07-18 ENCOUNTER — Ambulatory Visit: Payer: Commercial Managed Care - PPO | Admitting: Cardiology

## 2020-07-18 ENCOUNTER — Encounter: Payer: Self-pay | Admitting: Cardiology

## 2020-07-18 VITALS — BP 116/82 | HR 64 | Ht 70.0 in | Wt 220.2 lb

## 2020-07-18 DIAGNOSIS — E785 Hyperlipidemia, unspecified: Secondary | ICD-10-CM

## 2020-07-18 DIAGNOSIS — R011 Cardiac murmur, unspecified: Secondary | ICD-10-CM

## 2020-07-18 DIAGNOSIS — I10 Essential (primary) hypertension: Secondary | ICD-10-CM

## 2020-07-18 DIAGNOSIS — Z7189 Other specified counseling: Secondary | ICD-10-CM

## 2020-07-18 DIAGNOSIS — I2510 Atherosclerotic heart disease of native coronary artery without angina pectoris: Secondary | ICD-10-CM | POA: Diagnosis not present

## 2020-07-18 MED ORDER — LOSARTAN POTASSIUM 100 MG PO TABS: 100.0000 mg | ORAL_TABLET | Freq: Every day | ORAL | 3 refills | Status: DC

## 2020-07-18 MED ORDER — AMLODIPINE BESYLATE 10 MG PO TABS: 10.0000 mg | ORAL_TABLET | Freq: Every day | ORAL | 1 refills | Status: DC

## 2020-07-18 MED ORDER — METOPROLOL SUCCINATE ER 25 MG PO TB24: 25.0000 mg | ORAL_TABLET | Freq: Every day | ORAL | 3 refills | Status: DC

## 2020-07-18 MED ORDER — ROSUVASTATIN CALCIUM 40 MG PO TABS: 40.0000 mg | ORAL_TABLET | Freq: Every day | ORAL | 3 refills | Status: DC

## 2020-07-18 NOTE — Addendum Note (Signed)
Addended by: Teressa Senter on: 07/18/2020 10:36 AM   Modules accepted: Orders

## 2020-07-18 NOTE — Patient Instructions (Signed)
Medication Instructions:  No changes *If you need a refill on your cardiac medications before your next appointment, please call your pharmacy*  Lab Work: None ordered this visit.  Testing/Procedures: None ordered this visit  Follow-Up: At [facility], you and your health needs are our priority.  As part of our continuing mission to provide you with exceptional heart care, we have created designated Provider Care Teams.  These Care Teams include your primary Cardiologist (physician) and Advanced Practice Providers (APPs -  Physician Assistants and Nurse Practitioners) who all work together to provide you with the care you need, when you need it.  We recommend signing up for the patient portal called "MyChart".  Sign up information is provided on this After Visit Summary.  MyChart is used to connect with patients for Virtual Visits (Telemedicine).  Patients are able to view lab/test results, encounter notes, upcoming appointments, etc.  Non-urgent messages can be sent to your provider as well.   To learn more about what you can do with MyChart, go to [email or web address]://www.mychart.com.    Your next appointment:   12 month(s)  You will receive a reminder letter in the mail two months in advance. If you don't receive a letter, please call our office to schedule the follow-up appointment.  The format for your next appointment:   In Person  Provider:   [name], MD  

## 2020-07-19 DIAGNOSIS — I10 Essential (primary) hypertension: Secondary | ICD-10-CM

## 2020-07-19 DIAGNOSIS — Z79899 Other long term (current) drug therapy: Secondary | ICD-10-CM

## 2020-07-19 DIAGNOSIS — I2510 Atherosclerotic heart disease of native coronary artery without angina pectoris: Secondary | ICD-10-CM

## 2020-07-19 DIAGNOSIS — E785 Hyperlipidemia, unspecified: Secondary | ICD-10-CM

## 2020-07-21 MED ORDER — EZETIMIBE 10 MG PO TABS: 10.0000 mg | ORAL_TABLET | Freq: Every day | ORAL | 3 refills | Status: DC

## 2020-09-13 ENCOUNTER — Ambulatory Visit (INDEPENDENT_AMBULATORY_CARE_PROVIDER_SITE_OTHER): Payer: Commercial Managed Care - PPO

## 2020-09-13 ENCOUNTER — Other Ambulatory Visit: Payer: Self-pay

## 2020-09-13 ENCOUNTER — Ambulatory Visit (INDEPENDENT_AMBULATORY_CARE_PROVIDER_SITE_OTHER): Payer: Commercial Managed Care - PPO | Admitting: Sports Medicine

## 2020-09-13 DIAGNOSIS — M25551 Pain in right hip: Secondary | ICD-10-CM

## 2020-09-13 DIAGNOSIS — G8929 Other chronic pain: Secondary | ICD-10-CM | POA: Insufficient documentation

## 2020-09-13 NOTE — Progress Notes (Signed)
    Procedures performed today:    None.  Independent interpretation of notes and tests performed by another provider:   None.  Brief History, Exam, Impression, and Recommendations:    Chronic right hip pain Pleasant 54 year old male, he has had right hip pain for some time now. He had a few falls, he describes it with the C sign, anterolateral groin, with occasional catching and mechanical symptoms. He likely has a hip labral tear, starting with x-rays, hip conditioning exercises, if insufficient improvement we will proceed with MR arthrogram. I described the process of treatment including steroid injection, arthroscopy and potential hip arthroplasty. He does have a golfing trip coming up in the middle of next month and would like to have at least the injection done by then. If not feeling significantly better by couple of weeks we will proceed with MR arthrogram.    ___________________________________________ Michael Duran. Michael Duran, M.D., ABFM., CAQSM. Primary Care and Sports Medicine [facility]  Adjunct Instructor of Family Medicine  [employer]

## 2020-09-13 NOTE — Assessment & Plan Note (Signed)
Pleasant 54 year old male, he has had right hip pain for some time now. He had a few falls, he describes it with the C sign, anterolateral groin, with occasional catching and mechanical symptoms. He likely has a hip labral tear, starting with x-rays, hip conditioning exercises, if insufficient improvement we will proceed with MR arthrogram. I described the process of treatment including steroid injection, arthroscopy and potential hip arthroplasty. He does have a golfing trip coming up in the middle of next month and would like to have at least the injection done by then. If not feeling significantly better by couple of weeks we will proceed with MR arthrogram.

## 2020-10-03 ENCOUNTER — Ambulatory Visit (INDEPENDENT_AMBULATORY_CARE_PROVIDER_SITE_OTHER): Payer: Commercial Managed Care - PPO

## 2020-10-03 ENCOUNTER — Ambulatory Visit (INDEPENDENT_AMBULATORY_CARE_PROVIDER_SITE_OTHER): Payer: Commercial Managed Care - PPO | Admitting: Sports Medicine

## 2020-10-03 ENCOUNTER — Other Ambulatory Visit: Payer: Self-pay

## 2020-10-03 DIAGNOSIS — M7061 Trochanteric bursitis, right hip: Secondary | ICD-10-CM

## 2020-10-03 DIAGNOSIS — M25551 Pain in right hip: Secondary | ICD-10-CM

## 2020-10-03 DIAGNOSIS — G8929 Other chronic pain: Secondary | ICD-10-CM | POA: Diagnosis not present

## 2020-10-03 NOTE — Progress Notes (Signed)
    Procedures performed today:    Procedure: Real-time Ultrasound Guided injection of the right hip Device: Samsung HS60  Verbal informed consent obtained.  Time-out conducted.  Noted no overlying erythema, induration, or other signs of local infection.  Skin prepped in a sterile fashion.  Local anesthesia: Topical Ethyl chloride.  With sterile technique and under real time ultrasound guidance:  Mild synovitis, 1 cc Kenalog 40, 2 cc lidocaine, 2 cc bupivacaine injected easily Completed without difficulty  Advised to call if fevers/chills, erythema, induration, drainage, or persistent bleeding.  Images permanently stored and available for review in PACS.  Impression: Technically successful ultrasound guided injection.  Independent interpretation of notes and tests performed by another provider:   None.  Brief History, Exam, Impression, and Recommendations:    Chronic right hip pain This is a pleasant 54 year old male, right hip for some time now, he has a positive C sign, anterolateral groin pain as well as mechanical symptoms and catching. We discussed potential hip MR arthrogram, he has a golf trip coming up this weekend and prefers to get the injection done today, if recurrence of pain we can certainly proceed with MR arthrogram, return to see me in a month.    ___________________________________________ [name]. [name], M.D., ABFM., CAQSM. Primary Care and Sports Medicine [facility]  Adjunct Instructor of Family Medicine  [employer]

## 2020-10-03 NOTE — Assessment & Plan Note (Signed)
This is a pleasant 54 year old male, right hip for some time now, he has a positive C sign, anterolateral groin pain as well as mechanical symptoms and catching. We discussed potential hip MR arthrogram, he has a golf trip coming up this weekend and prefers to get the injection done today, if recurrence of pain we can certainly proceed with MR arthrogram, return to see me in a month.

## 2020-10-04 ENCOUNTER — Ambulatory Visit: Payer: Commercial Managed Care - PPO | Admitting: Sports Medicine

## 2020-11-23 ENCOUNTER — Other Ambulatory Visit: Payer: Self-pay | Admitting: Cardiology

## 2021-07-20 ENCOUNTER — Telehealth: Payer: Self-pay | Admitting: Cardiology

## 2021-07-20 ENCOUNTER — Other Ambulatory Visit: Payer: Self-pay | Admitting: Cardiology

## 2021-07-20 MED ORDER — EZETIMIBE 10 MG PO TABS: 10.0000 mg | ORAL_TABLET | Freq: Every day | ORAL | 0 refills | Status: DC

## 2021-07-20 NOTE — Telephone Encounter (Signed)
*  STAT* If patient is at the pharmacy, call can be transferred to refill team.   1. Which medications need to be refilled? (please list name of each medication and dose if known)  ezetimibe (ZETIA) 10 MG tablet  2. Which pharmacy/location (including street and city if local pharmacy) is medication to be sent to? [facility] [address] [place], [place]   3. Do they need a 30 day or 90 day supply? 90 day  Patient is out of medication.

## 2021-07-20 NOTE — Telephone Encounter (Signed)
Rx(s) sent to pharmacy electronically.  

## 2021-08-12 NOTE — Progress Notes (Signed)
Cardiology Office Note   Date:  08/13/2021   ID:  Michael Duran, DOB September 30, 1966, MRN [id number]  PCP:  Tally Joe, MD  Cardiologist:   None   Chief Complaint  Patient presents with   Coronary Artery Disease       History of Present Illness: Michael Duran is a 55 y.o. male who presents for follow up of CAD after CABG.  Since I last saw him he has done well. His 56 year old daughter had stage IV sarcoma but is in remission and training in nuclear medicine.  He has not been exercising quite as much with the stress of this but he is getting back into it and he just recently started losing some weight.  His weight is actually down further than it has been since 2018. The patient denies any new symptoms such as chest discomfort, neck or arm discomfort. There has been no new shortness of breath, PND or orthopnea. There have been no reported palpitations, presyncope or syncope.   Past Medical History:  Diagnosis Date   Chronic lower back pain    Coronary artery disease    Diabetes (HCC)    not since losing weight in 2006   Gout    HTN (hypertension)    Hyperlipemia    mild   Kidney stone    "passed it"   Sleep apnea    not an issue with weight loss in 2006    Past Surgical History:  Procedure Laterality Date   BACK SURGERY     CARDIAC CATHETERIZATION  08/31/2014   CORONARY ARTERY BYPASS GRAFT N/A 09/02/2014   Procedure: CORONARY ARTERY BYPASS GRAFTING (CABG), ON PUMP, TIMES TWO, USING BILATERAL INTERNAL MAMMARY ARTERIES.;  Surgeon: Loreli Slot, MD;  Location: [facility] OR;  Service: Open Heart Surgery;  Laterality: N/A;  CABG w/ bilateral mammaries   LEFT HEART CATHETERIZATION WITH CORONARY ANGIOGRAM N/A 08/31/2014   Procedure: LEFT HEART CATHETERIZATION WITH CORONARY ANGIOGRAM;  Surgeon: Lesleigh Noe, MD;  Location: [facility] CATH LAB;  Service: Cardiovascular;  Laterality: N/A;   LUMBAR LAMINECTOMY  11/2003   TEE WITHOUT CARDIOVERSION N/A 09/02/2014   Procedure:  TRANSESOPHAGEAL ECHOCARDIOGRAM (TEE);  Surgeon: Loreli Slot, MD;  Location: [facility] OR;  Service: Open Heart Surgery;  Laterality: N/A;   TONSILLECTOMY  ~ 1970     Current Outpatient Medications  Medication Sig Dispense Refill   allopurinol (ZYLOPRIM) 100 MG tablet Take 1 tablet by mouth daily.     aspirin 81 MG tablet Take 81 mg by mouth daily.     famotidine (PEPCID) 20 MG tablet Take 20 mg by mouth 2 (two) times daily.     Fish Oil-Cholecalciferol (FISH OIL + D3 PO) Take 1 capsule by mouth 2 (two) times daily.      amLODipine (NORVASC) 10 MG tablet Take 1 tablet (10 mg total) by mouth daily. 90 tablet 3   ezetimibe (ZETIA) 10 MG tablet Take 1 tablet (10 mg total) by mouth daily. 90 tablet 3   losartan (COZAAR) 100 MG tablet Take 1 tablet (100 mg total) by mouth daily. 90 tablet 3   metoprolol succinate (TOPROL-XL) 25 MG 24 hr tablet Take 1 tablet (25 mg total) by mouth daily. 90 tablet 3   rosuvastatin (CRESTOR) 40 MG tablet Take 1 tablet (40 mg total) by mouth daily. 90 tablet 3   sildenafil (REVATIO) 20 MG tablet Take 2-3 tablets (40-60 mg total) by mouth daily as needed. 30 tablet 3  No current facility-administered medications for this visit.    Allergies:   Patient has no known allergies.   ROS:  Please see the history of present illness.   Otherwise, review of systems are positive for ED.   All other systems are reviewed and negative.    PHYSICAL EXAM: VS:  BP 130/78   Pulse 65   Ht 5\' 10"  (1.778 m)   Wt 215 lb 9.6 oz (97.8 kg)   SpO2 98%   BMI 30.94 kg/m  , BMI Body mass index is 30.94 kg/m. GENERAL:  Well appearing NECK:  No jugular venous distention, waveform within normal limits, carotid upstroke brisk and symmetric, no bruits, no thyromegaly LUNGS:  Clear to auscultation bilaterally CHEST:  Well healed sternotomy scar. HEART:  PMI not displaced or sustained,S1 and S2 within normal limits, no S3, no S4, no clicks, no rubs no murmurs ABD:  Flat, positive bowel  sounds normal in frequency in pitch, no bruits, no rebound, no guarding, no midline pulsatile mass, no hepatomegaly, no splenomegaly EXT:  2 plus pulses throughout, no edema, no cyanosis no clubbing   EKG:  EKG is  ordered today. The ekg ordered today demonstrates sinus rhythm, rate 65, axis within normal limits, intervals within normal limits, no acute ST-T wave changes.   Recent Labs: No results found for requested labs within last 8760 hours.    Lipid Panel    Component Value Date/Time   CHOL 171 11/09/2019 0858   TRIG 363 (H) 11/09/2019 0858   HDL 37 (L) 11/09/2019 0858   CHOLHDL 4.6 11/09/2019 0858   LDLCALC 76 11/09/2019 0858      Wt Readings from Last 3 Encounters:  08/13/21 215 lb 9.6 oz (97.8 kg)  07/18/20 220 lb 3.2 oz (99.9 kg)  04/10/20 233 lb (105.7 kg)      Other studies Reviewed: Additional studies/ records that were reviewed today include: Labs. Review of the above records demonstrates:  Please see elsewhere in the note.     ASSESSMENT AND PLAN:     CAD:   The patient has no new sypmtoms.  No further cardiovascular testing is indicated.  We will continue with aggressive risk reduction and meds as listed.  DYSLIPIDEMIA:     LDL was mildly elevated last year and I will repeat this today.   HTN:    The blood pressure is at target.  No change in therapy.    MURMUR:   He had mild aortic sclerosis on echo in 2019.  This is not changed clinically.  No change in therapy.   Current medicines are reviewed at length with the patient today.  The patient does not have concerns regarding medicines.  The following changes have been made:  None  Labs/ tests ordered today include:   Orders Placed This Encounter  Procedures   Hemoglobin A1c   CBC   Comprehensive metabolic panel   Lipid panel   EKG 12-Lead      Disposition:   FU with me in 12 months.    Signed, 2020, MD  08/13/2021 9:17 AM    [facility]

## 2021-08-13 ENCOUNTER — Other Ambulatory Visit: Payer: Self-pay

## 2021-08-13 ENCOUNTER — Encounter: Payer: Self-pay | Admitting: Cardiology

## 2021-08-13 ENCOUNTER — Ambulatory Visit (INDEPENDENT_AMBULATORY_CARE_PROVIDER_SITE_OTHER): Payer: Managed Care, Other (non HMO) | Admitting: Cardiology

## 2021-08-13 VITALS — BP 130/78 | HR 65 | Ht 70.0 in | Wt 215.6 lb

## 2021-08-13 DIAGNOSIS — I2510 Atherosclerotic heart disease of native coronary artery without angina pectoris: Secondary | ICD-10-CM

## 2021-08-13 DIAGNOSIS — R011 Cardiac murmur, unspecified: Secondary | ICD-10-CM | POA: Diagnosis not present

## 2021-08-13 DIAGNOSIS — E785 Hyperlipidemia, unspecified: Secondary | ICD-10-CM | POA: Diagnosis not present

## 2021-08-13 DIAGNOSIS — Z79899 Other long term (current) drug therapy: Secondary | ICD-10-CM

## 2021-08-13 DIAGNOSIS — I10 Essential (primary) hypertension: Secondary | ICD-10-CM | POA: Diagnosis not present

## 2021-08-13 LAB — CBC
Hematocrit: 43.1 % (ref 37.5–51.0)
Hemoglobin: 14.9 g/dL (ref 13.0–17.7)
MCH: 28.8 pg (ref 26.6–33.0)
MCHC: 34.6 g/dL (ref 31.5–35.7)
MCV: 83 fL (ref 79–97)
Platelets: 216 10*3/uL (ref 150–450)
RBC: 5.18 x10E6/uL (ref 4.14–5.80)
RDW: 13.1 % (ref 11.6–15.4)
WBC: 5.1 10*3/uL (ref 3.4–10.8)

## 2021-08-13 LAB — LIPID PANEL
Chol/HDL Ratio: 2.7 ratio (ref 0.0–5.0)
Cholesterol, Total: 105 mg/dL (ref 100–199)
HDL: 39 mg/dL — ABNORMAL LOW (ref >39)
LDL Chol Calc (NIH): 48 mg/dL (ref 0–99)
Triglycerides: 96 mg/dL (ref 0–149)
VLDL Cholesterol Cal: 18 mg/dL (ref 5–40)

## 2021-08-13 LAB — COMPREHENSIVE METABOLIC PANEL
ALT: 34 IU/L (ref 0–44)
AST: 37 IU/L (ref 0–40)
Albumin/Globulin Ratio: 2.9 — ABNORMAL HIGH (ref 1.2–2.2)
Albumin: 4.9 g/dL (ref 3.8–4.9)
Alkaline Phosphatase: 68 IU/L (ref 44–121)
BUN/Creatinine Ratio: 16 (ref 9–20)
BUN: 14 mg/dL (ref 6–24)
Bilirubin Total: 0.9 mg/dL (ref 0.0–1.2)
CO2: 24 mmol/L (ref 20–29)
Calcium: 10 mg/dL (ref 8.7–10.2)
Chloride: 103 mmol/L (ref 96–106)
Creatinine, Ser: 0.85 mg/dL (ref 0.76–1.27)
Globulin, Total: 1.7 g/dL (ref 1.5–4.5)
Glucose: 112 mg/dL — ABNORMAL HIGH (ref 65–99)
Potassium: 5.3 mmol/L — ABNORMAL HIGH (ref 3.5–5.2)
Sodium: 140 mmol/L (ref 134–144)
Total Protein: 6.6 g/dL (ref 6.0–8.5)
eGFR: 102 mL/min/{1.73_m2} (ref >59)

## 2021-08-13 LAB — HEMOGLOBIN A1C
Est. average glucose Bld gHb Est-mCnc: 192 mg/dL
Hgb A1c MFr Bld: 8.3 % — ABNORMAL HIGH (ref 4.8–5.6)

## 2021-08-13 MED ORDER — EZETIMIBE 10 MG PO TABS: 10.0000 mg | ORAL_TABLET | Freq: Every day | ORAL | 3 refills | Status: DC

## 2021-08-13 MED ORDER — ROSUVASTATIN CALCIUM 40 MG PO TABS: 40.0000 mg | ORAL_TABLET | Freq: Every day | ORAL | 3 refills | Status: DC

## 2021-08-13 MED ORDER — SILDENAFIL CITRATE 20 MG PO TABS: 40.0000 mg | ORAL_TABLET | Freq: Every day | ORAL | 3 refills | Status: DC | PRN

## 2021-08-13 MED ORDER — LOSARTAN POTASSIUM 100 MG PO TABS: 100.0000 mg | ORAL_TABLET | Freq: Every day | ORAL | 3 refills | Status: DC

## 2021-08-13 MED ORDER — AMLODIPINE BESYLATE 10 MG PO TABS: 10.0000 mg | ORAL_TABLET | Freq: Every day | ORAL | 3 refills | Status: DC

## 2021-08-13 MED ORDER — METOPROLOL SUCCINATE ER 25 MG PO TB24: 25.0000 mg | ORAL_TABLET | Freq: Every day | ORAL | 3 refills | Status: DC

## 2021-08-13 NOTE — Patient Instructions (Signed)
Medication Instructions:  Your Physician recommend you continue on your current medication as directed.    *If you need a refill on your cardiac medications before your next appointment, please call your pharmacy*   Lab Work: Your physician recommends lab work today (A1C, CBC, CMP, Lipid)  If you have labs (blood work) drawn today and your tests are completely normal, you will receive your results only by: MyChart Message (if you have MyChart) OR A paper copy in the mail If you have any lab test that is abnormal or we need to change your treatment, we will call you to review the results.   Testing/Procedures: None ordered today   Follow-Up: At [facility], you and your health needs are our priority.  As part of our continuing mission to provide you with exceptional heart care, we have created designated Provider Care Teams.  These Care Teams include your primary Cardiologist (physician) and Advanced Practice Providers (APPs -  Physician Assistants and Nurse Practitioners) who all work together to provide you with the care you need, when you need it.  We recommend signing up for the patient portal called "MyChart".  Sign up information is provided on this After Visit Summary.  MyChart is used to connect with patients for Virtual Visits (Telemedicine).  Patients are able to view lab/test results, encounter notes, upcoming appointments, etc.  Non-urgent messages can be sent to your provider as well.   To learn more about what you can do with MyChart, go to [email or web address].    Your next appointment:   1 year(s)  The format for your next appointment:   In Person  Provider:   Rollene Rotunda, MD

## 2022-03-05 ENCOUNTER — Ambulatory Visit (INDEPENDENT_AMBULATORY_CARE_PROVIDER_SITE_OTHER): Payer: BC Managed Care – PPO | Admitting: Sports Medicine

## 2022-03-05 ENCOUNTER — Ambulatory Visit (INDEPENDENT_AMBULATORY_CARE_PROVIDER_SITE_OTHER): Payer: BC Managed Care – PPO

## 2022-03-05 DIAGNOSIS — M7542 Impingement syndrome of left shoulder: Secondary | ICD-10-CM | POA: Diagnosis not present

## 2022-03-05 DIAGNOSIS — M7541 Impingement syndrome of right shoulder: Secondary | ICD-10-CM

## 2022-03-05 MED ORDER — MELOXICAM 15 MG PO TABS: ORAL_TABLET | ORAL | 3 refills | Status: DC

## 2022-03-05 NOTE — Progress Notes (Signed)
? ? ?  Procedures performed today:   ? ?None. ? ?Independent interpretation of notes and tests performed by another provider:  ? ?None. ? ?Brief History, Exam, Impression, and Recommendations:   ? ?Impingement syndrome of both shoulders ?Michael Duran is a pleasant 57 year old male, he has had several weeks of pain over the left deltoid, worse with abduction and overhead activities, on exam he does have positive impingement signs, no labral signs, no bicipital signs. ?Discussed the pathophysiology and anatomy, adding rotator cuff conditioning, meloxicam, x-rays. ?Return to see me in 6 weeks, injection if not better. ? ? ? ?___________________________________________ ?Gwen Her. Dianah Field, M.D., ABFM., CAQSM. ?Primary Care and Sports Medicine ?[facility] ? ?Adjunct Instructor of Family Medicine  ?[employer] ?

## 2022-03-05 NOTE — Assessment & Plan Note (Signed)
Michael Duran is a pleasant 56 year old male, he has had several weeks of pain over the left deltoid, worse with abduction and overhead activities, on exam he does have positive impingement signs, no labral signs, no bicipital signs. ?Discussed the pathophysiology and anatomy, adding rotator cuff conditioning, meloxicam, x-rays. ?Return to see me in 6 weeks, injection if not better. ?

## 2022-04-16 ENCOUNTER — Ambulatory Visit: Payer: BC Managed Care – PPO | Admitting: Sports Medicine

## 2022-04-24 ENCOUNTER — Ambulatory Visit: Payer: BC Managed Care – PPO | Admitting: Sports Medicine

## 2022-05-14 ENCOUNTER — Ambulatory Visit (INDEPENDENT_AMBULATORY_CARE_PROVIDER_SITE_OTHER): Payer: BC Managed Care – PPO | Admitting: Sports Medicine

## 2022-05-14 ENCOUNTER — Ambulatory Visit (INDEPENDENT_AMBULATORY_CARE_PROVIDER_SITE_OTHER): Payer: BC Managed Care – PPO

## 2022-05-14 ENCOUNTER — Encounter: Payer: Self-pay | Admitting: Sports Medicine

## 2022-05-14 DIAGNOSIS — M546 Pain in thoracic spine: Secondary | ICD-10-CM

## 2022-05-14 DIAGNOSIS — M7541 Impingement syndrome of right shoulder: Secondary | ICD-10-CM | POA: Diagnosis not present

## 2022-05-14 DIAGNOSIS — M7542 Impingement syndrome of left shoulder: Secondary | ICD-10-CM | POA: Diagnosis not present

## 2022-05-14 DIAGNOSIS — G8929 Other chronic pain: Secondary | ICD-10-CM

## 2022-05-14 MED ORDER — PREDNISONE 50 MG PO TABS: ORAL_TABLET | ORAL | 0 refills | Status: DC

## 2022-05-14 NOTE — Progress Notes (Signed)
    Procedures performed today:    None.  Independent interpretation of notes and tests performed by another provider:   None.  Brief History, Exam, Impression, and Recommendations:    Thoracic back pain Pleasant 56 year old male, has had months to years of midthoracic back pain, bilaterally. He was seen by his urologist, sounds like he had a CT that was unrevealing, urinalysis unrevealing. Pain is not pleuritic, worse when laying in bed at night. On exam he has tenderness along the spinous processes of his mid thoracic vertebrae. No red flag symptoms, nothing radicular, no constitutional symptoms. We will start conservatively, thoracic spine x-rays, home conditioning, 5 days of prednisone. Return to see me in about 4 weeks, we will proceed with T-spine MRI as well as potentially chronic back pain serum work-up if insufficiently improved. I did ask him to go ahead and get his CT from his urologist burned to a disc for my review.  Impingement syndrome of both shoulders Michael Duran has impingement syndrome left shoulder, he will get more aggressive with his conditioning. We will cut him some Thera-Band. Return to see me in 6 weeks for this.  Chronic process with exacerbation and pharmacologic intervention  ___________________________________________ [name]. [name], M.D., ABFM., CAQSM. Primary Care and Sports Medicine [facility]  Adjunct Instructor of Family Medicine  [employer]

## 2022-05-14 NOTE — Assessment & Plan Note (Signed)
Michael Duran has impingement syndrome left shoulder, he will get more aggressive with his conditioning. We will cut him some Thera-Band. Return to see me in 6 weeks for this.

## 2022-05-14 NOTE — Assessment & Plan Note (Signed)
Pleasant 56 year old male, has had months to years of midthoracic back pain, bilaterally. He was seen by his urologist, sounds like he had a CT that was unrevealing, urinalysis unrevealing. Pain is not pleuritic, worse when laying in bed at night. On exam he has tenderness along the spinous processes of his mid thoracic vertebrae. No red flag symptoms, nothing radicular, no constitutional symptoms. We will start conservatively, thoracic spine x-rays, home conditioning, 5 days of prednisone. Return to see me in about 4 weeks, we will proceed with T-spine MRI as well as potentially chronic back pain serum work-up if insufficiently improved. I did ask him to go ahead and get his CT from his urologist burned to a disc for my review.

## 2022-05-15 ENCOUNTER — Encounter: Payer: Self-pay | Admitting: Sports Medicine

## 2022-06-11 ENCOUNTER — Ambulatory Visit: Payer: BC Managed Care – PPO | Admitting: Sports Medicine

## 2022-06-26 ENCOUNTER — Ambulatory Visit (INDEPENDENT_AMBULATORY_CARE_PROVIDER_SITE_OTHER): Payer: BC Managed Care – PPO | Admitting: Sports Medicine

## 2022-06-26 DIAGNOSIS — M546 Pain in thoracic spine: Secondary | ICD-10-CM

## 2022-06-26 DIAGNOSIS — M7542 Impingement syndrome of left shoulder: Secondary | ICD-10-CM | POA: Diagnosis not present

## 2022-06-26 DIAGNOSIS — M7541 Impingement syndrome of right shoulder: Secondary | ICD-10-CM

## 2022-06-26 DIAGNOSIS — G8929 Other chronic pain: Secondary | ICD-10-CM

## 2022-06-26 NOTE — Assessment & Plan Note (Signed)
Michael Duran returns, he is a very pleasant 56 year old male, left shoulder impingement syndrome, he has done the conditioning and improved to some degree, still has some discomfort but it is not limiting. He will try to swing a golf club, and do 6 more weeks of home therapy and if insufficient improvement we will consider subacromial injection.

## 2022-06-26 NOTE — Progress Notes (Signed)
    Procedures performed today:    None.  Independent interpretation of notes and tests performed by another provider:   None.  Brief History, Exam, Impression, and Recommendations:    Impingement syndrome of both shoulders Michael Duran returns, he is a very pleasant 56 year old male, left shoulder impingement syndrome, he has done the conditioning and improved to some degree, still has some discomfort but it is not limiting. He will try to swing a golf club, and do 6 more weeks of home therapy and if insufficient improvement we will consider subacromial injection.  Thoracic back pain Much improved with prednisone, home conditioning, T-spine x-rays did show multilevel DDD. Return to see me as needed.    ____________________________________________ [name]. [name], M.D., ABFM., CAQSM., AME. Primary Care and Sports Medicine [facility]  Adjunct Professor of Family Medicine  [employer]  Restaurant manager, fast food

## 2022-06-26 NOTE — Assessment & Plan Note (Signed)
Much improved with prednisone, home conditioning, T-spine x-rays did show multilevel DDD. Return to see me as needed.

## 2022-07-17 ENCOUNTER — Other Ambulatory Visit: Payer: Self-pay | Admitting: Cardiology

## 2022-07-26 ENCOUNTER — Encounter: Payer: Self-pay | Admitting: Cardiology

## 2022-08-09 ENCOUNTER — Other Ambulatory Visit: Payer: Self-pay | Admitting: Cardiology

## 2022-08-12 ENCOUNTER — Ambulatory Visit: Payer: BC Managed Care – PPO | Admitting: Sports Medicine

## 2022-08-12 MED ORDER — ROSUVASTATIN CALCIUM 40 MG PO TABS: 40.0000 mg | ORAL_TABLET | Freq: Every day | ORAL | 3 refills | Status: DC

## 2022-08-23 DIAGNOSIS — Z0000 Encounter for general adult medical examination without abnormal findings: Secondary | ICD-10-CM | POA: Diagnosis not present

## 2022-08-23 DIAGNOSIS — Z23 Encounter for immunization: Secondary | ICD-10-CM | POA: Diagnosis not present

## 2022-08-23 DIAGNOSIS — E1169 Type 2 diabetes mellitus with other specified complication: Secondary | ICD-10-CM | POA: Diagnosis not present

## 2022-08-23 DIAGNOSIS — E7800 Pure hypercholesterolemia, unspecified: Secondary | ICD-10-CM | POA: Diagnosis not present

## 2022-08-23 DIAGNOSIS — I10 Essential (primary) hypertension: Secondary | ICD-10-CM | POA: Diagnosis not present

## 2022-08-23 DIAGNOSIS — M109 Gout, unspecified: Secondary | ICD-10-CM | POA: Diagnosis not present

## 2022-08-29 NOTE — Progress Notes (Signed)
Cardiology Office Note   Date:  08/30/2022   ID:  Michael Duran, DOB March 02, 1966, MRN [id number]  PCP:  Tally Joe, MD  Cardiologist:   Rollene Rotunda, MD   Chief Complaint  Patient presents with   Coronary Artery Disease       History of Present Illness: Michael Duran is a 56 y.o. male who presents for follow up of CAD after CABG.  Since I last saw him he has done very well.  Lost weight.  Has been eating well.  He walks daily.  His daughter who had stage IV sarcoma is in remission and graduated with her nuclear medicine license and is now working at [employer]. The patient denies any new symptoms such as chest discomfort, neck or arm discomfort. There has been no new shortness of breath, PND or orthopnea. There have been no reported palpitations, presyncope or syncope.   Past Medical History:  Diagnosis Date   Chronic lower back pain    Coronary artery disease    Diabetes (HCC)    not since losing weight in 2006   Gout    HTN (hypertension)    Hyperlipemia    mild   Kidney stone    "passed it"   Sleep apnea    not an issue with weight loss in 2006    Past Surgical History:  Procedure Laterality Date   BACK SURGERY     CARDIAC CATHETERIZATION  08/31/2014   CORONARY ARTERY BYPASS GRAFT N/A 09/02/2014   Procedure: CORONARY ARTERY BYPASS GRAFTING (CABG), ON PUMP, TIMES TWO, USING BILATERAL INTERNAL MAMMARY ARTERIES.;  Surgeon: Loreli Slot, MD;  Location: [facility] OR;  Service: Open Heart Surgery;  Laterality: N/A;  CABG w/ bilateral mammaries   LEFT HEART CATHETERIZATION WITH CORONARY ANGIOGRAM N/A 08/31/2014   Procedure: LEFT HEART CATHETERIZATION WITH CORONARY ANGIOGRAM;  Surgeon: Lesleigh Noe, MD;  [facility] C[facility] LAB;  Service: Cardiovascular;  Laterality: N/A;   LUMBAR LAMINECTOMY  11/2003   TEE WITHOUT CARDIOVERSION N/A 09/02/2014   Procedure: TRANSESOPHAGEAL ECHOCARDIOGRAM (TEE);  Surgeon: Loreli Slot, MD;  Location: [facility] OR;  Service: Open Heart  Surgery;  Laterality: N/A;   TONSILLECTOMY  ~ 1970     Current Outpatient Medications  Medication Sig Dispense Refill   allopurinol (ZYLOPRIM) 100 MG tablet Take 1 tablet by mouth daily.     aspirin 81 MG tablet Take 81 mg by mouth daily.     famotidine (PEPCID) 20 MG tablet Take 20 mg by mouth 2 (two) times daily.     Fish Oil-Cholecalciferol (FISH OIL + D3 PO) Take 1 capsule by mouth 2 (two) times daily.      amLODipine (NORVASC) 5 MG tablet Take 1 tablet (5 mg total) by mouth daily. 90 tablet 3   ezetimibe (ZETIA) 10 MG tablet Take 1 tablet (10 mg total) by mouth daily. 90 tablet 3   losartan (COZAAR) 100 MG tablet Take 1 tablet (100 mg total) by mouth daily. 90 tablet 3   metoprolol succinate (TOPROL-XL) 25 MG 24 hr tablet Take 1 tablet (25 mg total) by mouth daily. 90 tablet 3   rosuvastatin (CRESTOR) 40 MG tablet Take 1 tablet (40 mg total) by mouth daily. 90 tablet 3   sildenafil (REVATIO) 20 MG tablet Take 2-3 tablets (40-60 mg total) by mouth daily as needed. 30 tablet 3   No current facility-administered medications for this visit.    Allergies:   Patient has no known  allergies.   ROS:  Please see the history of present illness.   Otherwise, review of systems are positive for none.   All other systems are reviewed and negative.    PHYSICAL EXAM: VS:  BP 108/60 (BP Location: Right Arm, Patient Position: Sitting)   Pulse 61   Ht 5\' 10"  (1.778 m)   Wt 198 lb 12.8 oz (90.2 kg)   SpO2 98%   BMI 28.52 kg/m  , BMI Body mass index is 28.52 kg/m. GENERAL:  Well appearing NECK:  No jugular venous distention, waveform within normal limits, carotid upstroke brisk and symmetric, no bruits, no thyromegaly LUNGS:  Clear to auscultation bilaterally CHEST:  Well healed sternotomy scar. HEART:  PMI not displaced or sustained,S1 and S2 within normal limits, no S3, no S4, no clicks, no rubs, soft brief apical systolic murmur radiating slightly at aortic outflow tract, no diastolic  murmurs ABD:  Flat, positive bowel sounds normal in frequency in pitch, no bruits, no rebound, no guarding, no midline pulsatile mass, no hepatomegaly, no splenomegaly EXT:  2 plus pulses throughout, no edema, no cyanosis no clubbing  EKG:  EKG is  ordered today. The ekg ordered today demonstrates sinus rhythm, rate 61, axis within normal limits, intervals within normal limits, no acute ST-T wave changes.   Recent Labs: No results found for requested labs within last 365 days.    Lipid Panel    Component Value Date/Time   CHOL 105 08/13/2021 0933   TRIG 96 08/13/2021 0933   HDL 39 (L) 08/13/2021 0933   CHOLHDL 2.7 08/13/2021 0933   LDLCALC 48 08/13/2021 0933      Wt Readings from Last 3 Encounters:  08/30/22 198 lb 12.8 oz (90.2 kg)  08/13/21 215 lb 9.6 oz (97.8 kg)  07/18/20 220 lb 3.2 oz (99.9 kg)      Other studies Reviewed: Additional studies/ records that were reviewed today include: Labs. Review of the above records demonstrates:  Please see elsewhere in the note.     ASSESSMENT AND PLAN:     CAD:   The patient has no new sypmtoms.  No further cardiovascular testing is indicated.  We will continue with aggressive risk reduction and meds as listed.  DYSLIPIDEMIA:     LDL 44 with HDL 51.  No change in therapy.    HTN:   Blood pressure is actually running low as he has lost weight.  I would reduce his amlodipine to 5 mg daily.   MURMUR:   He had mild aortic sclerosis on echo in 2019.  I would not suspect that this has changed by exam.  I probably will repeat an echo next year as a little up in 5 years.    Current medicines are reviewed at length with the patient today.  The patient does not have concerns regarding medicines.  The following changes have been made: As above  Labs/ tests ordered today include: NA  Orders Placed This Encounter  Procedures   EKG 12-Lead      Disposition:   FU with me in 12 months.    Signed, Minus Breeding, MD  08/30/2022  10:10 AM    [place]

## 2022-08-30 ENCOUNTER — Encounter: Payer: Self-pay | Admitting: Cardiology

## 2022-08-30 ENCOUNTER — Ambulatory Visit: Payer: BC Managed Care – PPO | Attending: Cardiology | Admitting: Cardiology

## 2022-08-30 VITALS — BP 108/60 | HR 61 | Ht 70.0 in | Wt 198.8 lb

## 2022-08-30 DIAGNOSIS — R011 Cardiac murmur, unspecified: Secondary | ICD-10-CM

## 2022-08-30 DIAGNOSIS — I10 Essential (primary) hypertension: Secondary | ICD-10-CM

## 2022-08-30 DIAGNOSIS — I2510 Atherosclerotic heart disease of native coronary artery without angina pectoris: Secondary | ICD-10-CM | POA: Diagnosis not present

## 2022-08-30 DIAGNOSIS — E785 Hyperlipidemia, unspecified: Secondary | ICD-10-CM

## 2022-08-30 MED ORDER — METOPROLOL SUCCINATE ER 25 MG PO TB24: 25.0000 mg | ORAL_TABLET | Freq: Every day | ORAL | 3 refills | Status: DC

## 2022-08-30 MED ORDER — AMLODIPINE BESYLATE 5 MG PO TABS: 5.0000 mg | ORAL_TABLET | Freq: Every day | ORAL | 3 refills | Status: DC

## 2022-08-30 MED ORDER — EZETIMIBE 10 MG PO TABS: 10.0000 mg | ORAL_TABLET | Freq: Every day | ORAL | 3 refills | Status: DC

## 2022-08-30 MED ORDER — SILDENAFIL CITRATE 20 MG PO TABS: 40.0000 mg | ORAL_TABLET | Freq: Every day | ORAL | 3 refills | Status: DC | PRN

## 2022-08-30 MED ORDER — ROSUVASTATIN CALCIUM 40 MG PO TABS: 40.0000 mg | ORAL_TABLET | Freq: Every day | ORAL | 3 refills | Status: DC

## 2022-08-30 MED ORDER — LOSARTAN POTASSIUM 100 MG PO TABS: 100.0000 mg | ORAL_TABLET | Freq: Every day | ORAL | 3 refills | Status: DC

## 2022-08-30 NOTE — Patient Instructions (Signed)
Medication Instructions:   DECREASE AMLODIPINE TO 5 MG ONCE DAILY= 1/2 OF THE 10 MG TABLET ONCE DAILY  *If you need a refill on your cardiac medications before your next appointment, please call your pharmacy*   Follow-Up: At [facility], you and your health needs are our priority.  As part of our continuing mission to provide you with exceptional heart care, we have created designated Provider Care Teams.  These Care Teams include your primary Cardiologist (physician) and Advanced Practice Providers (APPs -  Physician Assistants and Nurse Practitioners) who all work together to provide you with the care you need, when you need it.  We recommend signing up for the patient portal called "MyChart".  Sign up information is provided on this After Visit Summary.  MyChart is used to connect with patients for Virtual Visits (Telemedicine).  Patients are able to view lab/test results, encounter notes, upcoming appointments, etc.  Non-urgent messages can be sent to your provider as well.   To learn more about what you can do with MyChart, go to [email or web address].    Your next appointment:   12 month(s)  The format for your next appointment:   In Person  Provider:   Minus Breeding MD

## 2022-10-29 DIAGNOSIS — H9202 Otalgia, left ear: Secondary | ICD-10-CM | POA: Diagnosis not present

## 2022-10-29 DIAGNOSIS — M26629 Arthralgia of temporomandibular joint, unspecified side: Secondary | ICD-10-CM | POA: Diagnosis not present

## 2022-11-11 ENCOUNTER — Ambulatory Visit: Payer: BC Managed Care – PPO | Admitting: Family Medicine

## 2023-02-24 DIAGNOSIS — E1169 Type 2 diabetes mellitus with other specified complication: Secondary | ICD-10-CM | POA: Diagnosis not present

## 2023-02-24 DIAGNOSIS — I10 Essential (primary) hypertension: Secondary | ICD-10-CM | POA: Diagnosis not present

## 2023-02-24 DIAGNOSIS — E7800 Pure hypercholesterolemia, unspecified: Secondary | ICD-10-CM | POA: Diagnosis not present

## 2023-02-24 DIAGNOSIS — M109 Gout, unspecified: Secondary | ICD-10-CM | POA: Diagnosis not present

## 2023-09-05 ENCOUNTER — Other Ambulatory Visit: Payer: Self-pay | Admitting: Cardiology

## 2023-09-18 ENCOUNTER — Other Ambulatory Visit: Payer: Self-pay | Admitting: Cardiology

## 2023-10-01 ENCOUNTER — Other Ambulatory Visit: Payer: Self-pay | Admitting: Cardiology

## 2023-10-02 DIAGNOSIS — M109 Gout, unspecified: Secondary | ICD-10-CM | POA: Diagnosis not present

## 2023-10-02 DIAGNOSIS — Z125 Encounter for screening for malignant neoplasm of prostate: Secondary | ICD-10-CM | POA: Diagnosis not present

## 2023-10-02 DIAGNOSIS — Z23 Encounter for immunization: Secondary | ICD-10-CM | POA: Diagnosis not present

## 2023-10-02 DIAGNOSIS — E7800 Pure hypercholesterolemia, unspecified: Secondary | ICD-10-CM | POA: Diagnosis not present

## 2023-10-02 DIAGNOSIS — I10 Essential (primary) hypertension: Secondary | ICD-10-CM | POA: Diagnosis not present

## 2023-10-02 DIAGNOSIS — E1169 Type 2 diabetes mellitus with other specified complication: Secondary | ICD-10-CM | POA: Diagnosis not present

## 2023-10-02 DIAGNOSIS — Z0000 Encounter for general adult medical examination without abnormal findings: Secondary | ICD-10-CM | POA: Diagnosis not present

## 2023-10-02 LAB — COMPREHENSIVE METABOLIC PANEL: EGFR: 102

## 2023-10-08 ENCOUNTER — Other Ambulatory Visit: Payer: Self-pay | Admitting: Cardiology

## 2023-10-15 ENCOUNTER — Encounter: Payer: Self-pay | Admitting: *Deleted

## 2023-10-19 NOTE — Progress Notes (Unsigned)
  Cardiology Office Note:   Date:  10/20/2023  ID:  Michael Duran, DOB [birth year], MRN [id number] PCP: Tally Joe, MD  [facility] Providers Cardiologist:  Rollene Rotunda, MD {  History of Present Illness:   Michael Duran is a 57 y.o. male who presents for follow up of CAD after CABG.  Since I last saw him he has had no new problems.  He has 1 son who lives in [place].  He has another son who lives in [place].  His daughter has had recurrent sarcoma and has had radiation.  She just got married to a IT sales professional.  The patient denies any new symptoms such as chest discomfort, neck or arm discomfort. There has been no new shortness of breath, PND or orthopnea. There have been no reported palpitations, presyncope or syncope.  He exercises routinely at least 2 miles a day.  He lost about 50 pounds through diet.   ROS: As stated in the HPI and negative for all other systems.  Studies Reviewed:    EKG:   EKG Interpretation Date/Time:  Monday October 20 2023 11:57:50 EST Ventricular Rate:  54 PR Interval:  186 QRS Duration:  98 QT Interval:  422 QTC Calculation: 400 R Axis:   -9  Text Interpretation: Sinus bradycardia When compared with ECG of 06-Oct-2018 21:25, rate os slower Confirmed by Rollene Rotunda ([id number]) on 10/20/2023 12:13:31 PM    Risk Assessment/Calculations:              Physical Exam:   VS:  BP 108/68 (BP Location: Left Arm, Patient Position: Sitting, Cuff Size: Normal)   Pulse (!) 56   Ht 5' 9.5" (1.765 m)   Wt 190 lb (86.2 kg)   SpO2 95%   BMI 27.66 kg/m    Wt Readings from Last 3 Encounters:  10/20/23 190 lb (86.2 kg)  08/30/22 198 lb 12.8 oz (90.2 kg)  08/13/21 215 lb 9.6 oz (97.8 kg)     GEN: Well nourished, well developed in no acute distress NECK: No JVD; No carotid bruits CARDIAC: RRR, 3/6 apical early peaking systolic murmur, no diastolic murmurs, rubs, gallops RESPIRATORY:  Clear to auscultation without rales, wheezing or rhonchi   ABDOMEN: Soft, non-tender, non-distended EXTREMITIES:  No edema; No deformity   ASSESSMENT AND PLAN:   CAD:   The patient has no new sypmtoms.  No further cardiovascular testing is indicated.  We will continue with aggressive risk reduction and meds as listed.  HTN:   LDL was 56 with an HDL of 51.  No change in therapy.  HTN:   Blood pressure is excellent.  No change in therapy.    MURMUR:    He needs an echo for follow of aortic sclerosis.  It has been five years since his last study.     Follow up with me in one year.   Signed, Rollene Rotunda, MD

## 2023-10-20 ENCOUNTER — Encounter: Payer: Self-pay | Admitting: Cardiology

## 2023-10-20 ENCOUNTER — Ambulatory Visit: Payer: BC Managed Care – PPO | Attending: Cardiology | Admitting: Cardiology

## 2023-10-20 VITALS — BP 108/68 | HR 56 | Ht 69.5 in | Wt 190.0 lb

## 2023-10-20 DIAGNOSIS — I350 Nonrheumatic aortic (valve) stenosis: Secondary | ICD-10-CM

## 2023-10-20 DIAGNOSIS — I10 Essential (primary) hypertension: Secondary | ICD-10-CM

## 2023-10-20 DIAGNOSIS — I2510 Atherosclerotic heart disease of native coronary artery without angina pectoris: Secondary | ICD-10-CM

## 2023-10-20 DIAGNOSIS — R011 Cardiac murmur, unspecified: Secondary | ICD-10-CM

## 2023-10-20 MED ORDER — EZETIMIBE 10 MG PO TABS: 10.0000 mg | ORAL_TABLET | Freq: Every day | ORAL | 3 refills | Status: DC

## 2023-10-20 MED ORDER — AMLODIPINE BESYLATE 5 MG PO TABS: 5.0000 mg | ORAL_TABLET | Freq: Every day | ORAL | 11 refills | Status: DC

## 2023-10-20 MED ORDER — LOSARTAN POTASSIUM 100 MG PO TABS: 100.0000 mg | ORAL_TABLET | Freq: Every day | ORAL | 3 refills | Status: DC

## 2023-10-20 MED ORDER — SILDENAFIL CITRATE 20 MG PO TABS: 40.0000 mg | ORAL_TABLET | Freq: Every day | ORAL | 3 refills | Status: DC | PRN

## 2023-10-20 MED ORDER — ROSUVASTATIN CALCIUM 40 MG PO TABS: 40.0000 mg | ORAL_TABLET | Freq: Every day | ORAL | 3 refills | Status: DC

## 2023-10-20 MED ORDER — METOPROLOL SUCCINATE ER 25 MG PO TB24: 25.0000 mg | ORAL_TABLET | Freq: Every day | ORAL | 11 refills | Status: DC

## 2023-10-20 NOTE — Patient Instructions (Signed)
Medication Instructions:  No changes. New scripts sent. *If you need a refill on your cardiac medications before your next appointment, please call your pharmacy*  Testing/Procedures: Your physician has requested that you have an echocardiogram. Echocardiography is a painless test that uses sound waves to create images of your heart. It provides your doctor with information about the size and shape of your heart and how well your heart's chambers and valves are working. This procedure takes approximately one hour. There are no restrictions for this procedure. Please do NOT wear cologne, perfume, aftershave, or lotions (deodorant is allowed).  [address]  Please arrive 15 minutes prior to your appointment time.  Please note: We ask at that you not bring children with you during ultrasound (echo/ vascular) testing. Due to room size and safety concerns, children are not allowed in the ultrasound rooms during exams. Our front office staff cannot provide observation of children in our lobby area while testing is being conducted. An adult accompanying a patient to their appointment will only be allowed in the ultrasound room at the discretion of the ultrasound technician under special circumstances. We apologize for any inconvenience.    Follow-Up: At [facility], you and your health needs are our priority.  As part of our continuing mission to provide you with exceptional heart care, we have created designated Provider Care Teams.  These Care Teams include your primary Cardiologist (physician) and Advanced Practice Providers (APPs -  Physician Assistants and Nurse Practitioners) who all work together to provide you with the care you need, when you need it.   Your next appointment:   12 month(s)  Provider:   Rollene Rotunda, MD

## 2023-10-28 ENCOUNTER — Ambulatory Visit (INDEPENDENT_AMBULATORY_CARE_PROVIDER_SITE_OTHER): Payer: BC Managed Care – PPO | Admitting: Sports Medicine

## 2023-10-28 ENCOUNTER — Encounter: Payer: Self-pay | Admitting: Sports Medicine

## 2023-10-28 ENCOUNTER — Ambulatory Visit: Payer: BC Managed Care – PPO

## 2023-10-28 DIAGNOSIS — M47816 Spondylosis without myelopathy or radiculopathy, lumbar region: Secondary | ICD-10-CM

## 2023-10-28 DIAGNOSIS — M4726 Other spondylosis with radiculopathy, lumbar region: Secondary | ICD-10-CM | POA: Diagnosis not present

## 2023-10-28 DIAGNOSIS — M545 Low back pain, unspecified: Secondary | ICD-10-CM

## 2023-10-28 MED ORDER — PREDNISONE 50 MG PO TABS: ORAL_TABLET | ORAL | 0 refills | Status: AC

## 2023-10-28 MED ORDER — METHYLPREDNISOLONE SODIUM SUCC 125 MG IJ SOLR
125.0000 mg | Freq: Once | INTRAMUSCULAR | Status: AC
  Administered 2023-10-28: 125 mg via INTRAVENOUS

## 2023-10-28 MED ORDER — KETOROLAC TROMETHAMINE 30 MG/ML IJ SOLN
30.0000 mg | Freq: Once | INTRAMUSCULAR | Status: AC
  Administered 2023-10-28: 30 mg via INTRAMUSCULAR

## 2023-10-28 MED ORDER — PREDNISONE 50 MG PO TABS: ORAL_TABLET | ORAL | 0 refills | Status: DC

## 2023-10-28 NOTE — Addendum Note (Signed)
Addended by: Carren Rang A on: 10/28/2023 03:10 PM   Modules accepted: Orders

## 2023-10-28 NOTE — Addendum Note (Signed)
Addended by: Carren Rang A on: 10/28/2023 03:30 PM   Modules accepted: Orders

## 2023-10-28 NOTE — Progress Notes (Signed)
    Procedures performed today:    None.  Independent interpretation of notes and tests performed by another provider:   None.  Brief History, Exam, Impression, and Recommendations:    Lumbar spondylosis This is a pleasant 57 year old male, he has a long history of axial low back pain, he is status post L5-S1 microdiscectomy in 2005 for right sided radiculopathy. He did well, until 2016, increasing pain, he ended up with an MRI at [facility] that did show L3-L4 disc desiccation with mild bulging, L4-L5 disc degeneration with a broad-based protrusion indenting the thecal sac and narrowing both lateral recesses, at L5-S1 there was the previous partial right hemilaminectomy with disc degeneration and protruding disc material here as well centrally and towards the left displacing the left S1 and the left L5 nerve roots. Facets looked okay. He did well until 2021, we treated him conservatively for recurrence of back pain. Now having an increase in back pain, he does have a trip coming up to [place] in a couple weeks. Pain is axial and worse with sitting, flexion, Valsalva with radiation to the left buttock and posterior thigh. Toradol 30, Solu-Medrol 125 intramuscular. Adding 5 days of prednisone, updated x-rays and home physical therapy, return to see me about 4 weeks, MR for interventional planning if not better.    ____________________________________________ [name]. [name], M.D., ABFM., CAQSM., AME. Primary Care and Sports Medicine [facility]  Adjunct Professor of Family Medicine  [employer]  Restaurant manager, fast food

## 2023-10-28 NOTE — Assessment & Plan Note (Signed)
This is a pleasant 57 year old male, he has a long history of axial low back pain, he is status post L5-S1 microdiscectomy in 2005 for right sided radiculopathy. He did well, until 2016, increasing pain, he ended up with an MRI at [facility] that did show L3-L4 disc desiccation with mild bulging, L4-L5 disc degeneration with a broad-based protrusion indenting the thecal sac and narrowing both lateral recesses, at L5-S1 there was the previous partial right hemilaminectomy with disc degeneration and protruding disc material here as well centrally and towards the left displacing the left S1 and the left L5 nerve roots. Facets looked okay. He did well until 2021, we treated him conservatively for recurrence of back pain. Now having an increase in back pain, he does have a trip coming up to [place] in a couple weeks. Pain is axial and worse with sitting, flexion, Valsalva with radiation to the left buttock and posterior thigh. Toradol 30, Solu-Medrol 125 intramuscular. Adding 5 days of prednisone, updated x-rays and home physical therapy, return to see me about 4 weeks, MR for interventional planning if not better.

## 2023-11-11 ENCOUNTER — Ambulatory Visit (HOSPITAL_BASED_OUTPATIENT_CLINIC_OR_DEPARTMENT_OTHER): Payer: BC Managed Care – PPO

## 2023-11-11 DIAGNOSIS — I350 Nonrheumatic aortic (valve) stenosis: Secondary | ICD-10-CM | POA: Diagnosis not present

## 2023-11-12 ENCOUNTER — Encounter: Payer: Self-pay | Admitting: Cardiology

## 2023-11-12 LAB — ECHOCARDIOGRAM COMPLETE
AR max vel: 1.64 cm2
AV Area VTI: 1.6 cm2
AV Area mean vel: 1.6 cm2
AV Mean grad: 11 mm[Hg]
AV Peak grad: 21.2 mm[Hg]
Ao pk vel: 2.3 m/s
Area-P 1/2: 2.95 cm2
S' Lateral: 2.4 cm

## 2023-11-24 ENCOUNTER — Ambulatory Visit: Payer: BC Managed Care – PPO | Admitting: Sports Medicine

## 2024-01-29 ENCOUNTER — Other Ambulatory Visit (HOSPITAL_COMMUNITY): Payer: Self-pay

## 2024-01-29 ENCOUNTER — Telehealth: Payer: Self-pay | Admitting: Pharmacy Technician

## 2024-01-29 NOTE — Telephone Encounter (Signed)
 Pharmacy Patient Advocate Encounter   Received notification from CoverMyMeds that prior authorization for amlodipine is required/requested.   Insurance verification completed.   The patient is insured through [employer]  .   Per test claim: The current 01/29/24 day co-pay is, $0.00- one month.  No PA needed at this time. This test claim was processed through [facility]- copay amounts may vary at other pharmacies due to pharmacy/plan contracts, or as the patient moves through the different stages of their insurance plan.

## 2024-04-27 ENCOUNTER — Other Ambulatory Visit: Payer: Self-pay | Admitting: Cardiology

## 2024-05-25 DIAGNOSIS — H9312 Tinnitus, left ear: Secondary | ICD-10-CM | POA: Diagnosis not present

## 2024-05-25 DIAGNOSIS — H903 Sensorineural hearing loss, bilateral: Secondary | ICD-10-CM | POA: Diagnosis not present

## 2024-07-27 ENCOUNTER — Encounter: Payer: Self-pay | Admitting: Sports Medicine

## 2024-08-18 ENCOUNTER — Encounter: Payer: Self-pay | Admitting: Cardiology

## 2024-09-17 ENCOUNTER — Other Ambulatory Visit: Payer: Self-pay | Admitting: Cardiology

## 2024-09-21 MED ORDER — AMLODIPINE BESYLATE 5 MG PO TABS: 5.0000 mg | ORAL_TABLET | Freq: Every day | ORAL | 0 refills | Status: DC

## 2024-10-18 ENCOUNTER — Other Ambulatory Visit: Payer: Self-pay | Admitting: Cardiology

## 2024-10-19 DIAGNOSIS — M109 Gout, unspecified: Secondary | ICD-10-CM | POA: Diagnosis not present

## 2024-10-19 DIAGNOSIS — E119 Type 2 diabetes mellitus without complications: Secondary | ICD-10-CM | POA: Diagnosis not present

## 2024-10-19 DIAGNOSIS — E7800 Pure hypercholesterolemia, unspecified: Secondary | ICD-10-CM | POA: Diagnosis not present

## 2024-10-19 DIAGNOSIS — Z125 Encounter for screening for malignant neoplasm of prostate: Secondary | ICD-10-CM | POA: Diagnosis not present

## 2024-10-27 DIAGNOSIS — I10 Essential (primary) hypertension: Secondary | ICD-10-CM | POA: Diagnosis not present

## 2024-10-27 DIAGNOSIS — Z0000 Encounter for general adult medical examination without abnormal findings: Secondary | ICD-10-CM | POA: Diagnosis not present

## 2024-10-27 DIAGNOSIS — E785 Hyperlipidemia, unspecified: Secondary | ICD-10-CM | POA: Diagnosis not present

## 2024-10-27 DIAGNOSIS — I2510 Atherosclerotic heart disease of native coronary artery without angina pectoris: Secondary | ICD-10-CM | POA: Diagnosis not present

## 2024-10-31 NOTE — Progress Notes (Unsigned)
  Cardiology Office Note:   Date:  11/01/2024  ID:  Michael Duran, DOB 05-11-1966, MRN [id number] PCP: Seabron Lenis, MD  [facility] Providers Cardiologist:  Lynwood Schilling, MD {  History of Present Illness:   Michael Duran is a 58 y.o. male who presents for follow up of CAD after CABG.  Since I last saw him he has had no new problems.  Since I last saw him he has had no new cardiac problems.  The patient denies any new symptoms such as chest discomfort, neck or arm discomfort. There has been no new shortness of breath, PND or orthopnea. There have been no reported palpitations, presyncope or syncope.    He walks two miles per day.    Unfortunately his daughter who has sarcoma has just been told that there is no further treatment that can be offered at [facility] and she is going to get a second opinion at [facility].  He has 1 son who lives in [place].  He has another son is in social worker school in [place].  His daughter is married to a it sales professional and lives a few minutes from my patient.  Patient gained a couple pounds back after losing 50 pounds with diet and exercise.  ROS: As stated in the HPI and negative for all other systems.  Studies Reviewed:    EKG:   EKG Interpretation Date/Time:  Monday November 01 2024 09:52:20 EST Ventricular Rate:  64 PR Interval:  198 QRS Duration:  100 QT Interval:  388 QTC Calculation: 400 R Axis:   -20  Text Interpretation: Normal sinus rhythm Normal ECG When compared with ECG of 20-Oct-2023 11:57, No significant change was found Confirmed by Schilling Lynwood ([id number]) on 11/01/2024 10:10:20 AM    Risk Assessment/Calculations:              Physical Exam:   VS:  BP 129/76 (BP Location: Left Arm, Patient Position: Sitting)   Pulse 64   Ht 5' 9 (1.753 m)   Wt 201 lb 9.6 oz (91.4 kg)   SpO2 96%   BMI 29.77 kg/m    Wt Readings from Last 3 Encounters:  11/01/24 201 lb 9.6 oz (91.4 kg)  10/20/23 190 lb (86.2 kg)  08/30/22 198 lb 12.8 oz (90.2 kg)      GEN: Well nourished, well developed in no acute distress NECK: No JVD; No carotid bruits CARDIAC: RRR, no murmurs, rubs, gallops RESPIRATORY:  Clear to auscultation without rales, wheezing or rhonchi  ABDOMEN: Soft, non-tender, non-distended EXTREMITIES:  No edema; No deformity   ASSESSMENT AND PLAN:   CAD:  The patient has no new sypmtoms.  No further cardiovascular testing is indicated.  We will continue with aggressive risk reduction and meds as listed.   DYSLIPIDEMIA:   LDL was 42 with an HDL of 51.  No change in therapy.   HTN:   Blood pressure is excellent.  No change in therapy.     Follow up with me in 1 year.  Signed, Lynwood Schilling, MD

## 2024-11-01 ENCOUNTER — Encounter: Payer: Self-pay | Admitting: Cardiology

## 2024-11-01 ENCOUNTER — Ambulatory Visit: Attending: Cardiology | Admitting: Cardiology

## 2024-11-01 VITALS — BP 129/76 | HR 64 | Ht 69.0 in | Wt 201.6 lb

## 2024-11-01 DIAGNOSIS — I10 Essential (primary) hypertension: Secondary | ICD-10-CM

## 2024-11-01 DIAGNOSIS — E785 Hyperlipidemia, unspecified: Secondary | ICD-10-CM

## 2024-11-01 DIAGNOSIS — I2510 Atherosclerotic heart disease of native coronary artery without angina pectoris: Secondary | ICD-10-CM | POA: Diagnosis not present

## 2024-11-01 MED ORDER — ROSUVASTATIN CALCIUM 40 MG PO TABS: 40.0000 mg | ORAL_TABLET | Freq: Every day | ORAL | 3 refills | Status: AC

## 2024-11-01 MED ORDER — METOPROLOL SUCCINATE ER 25 MG PO TB24: 25.0000 mg | ORAL_TABLET | Freq: Every day | ORAL | 3 refills | Status: AC

## 2024-11-01 MED ORDER — SILDENAFIL CITRATE 20 MG PO TABS: 40.0000 mg | ORAL_TABLET | Freq: Every day | ORAL | 3 refills | Status: AC | PRN

## 2024-11-01 MED ORDER — EZETIMIBE 10 MG PO TABS: 10.0000 mg | ORAL_TABLET | Freq: Every day | ORAL | 3 refills | Status: AC

## 2024-11-01 MED ORDER — LOSARTAN POTASSIUM 100 MG PO TABS: 100.0000 mg | ORAL_TABLET | Freq: Every day | ORAL | 3 refills | Status: AC

## 2024-11-01 MED ORDER — AMLODIPINE BESYLATE 5 MG PO TABS: 5.0000 mg | ORAL_TABLET | Freq: Every day | ORAL | 3 refills | Status: AC

## 2024-11-01 NOTE — Patient Instructions (Signed)
 Medication Instructions:  Refilled cardiac medications *If you need a refill on your cardiac medications before your next appointment, please call your pharmacy*  Lab Work: NONE If you have labs (blood work) drawn today and your tests are completely normal, you will receive your results only by: MyChart Message (if you have MyChart) OR A paper copy in the mail If you have any lab test that is abnormal or we need to change your treatment, we will call you to review the results.  Testing/Procedures: NONE  Follow-Up: At [facility], you and your health needs are our priority.  As part of our continuing mission to provide you with exceptional heart care, our providers are all part of one team.  This team includes your primary Cardiologist (physician) and Advanced Practice Providers or APPs (Physician Assistants and Nurse Practitioners) who all work together to provide you with the care you need, when you need it.  Your next appointment:   1 year(s)  Provider:   Lynwood Schilling, MD    We recommend signing up for the patient portal called MyChart.  Sign up information is provided on this After Visit Summary.  MyChart is used to connect with patients for Virtual Visits (Telemedicine).  Patients are able to view lab/test results, encounter notes, upcoming appointments, etc.  Non-urgent messages can be sent to your provider as well.   To learn more about what you can do with MyChart, go to [email or web address].
